# Patient Record
Sex: Male | Born: 1935 | Race: White | Hispanic: No | Marital: Married | Smoking: Former smoker
Health system: Southern US, Community
[De-identification: ages and names within clinical notes are randomized; demographics above are authoritative.]

## PROBLEM LIST (undated history)

## (undated) DIAGNOSIS — J45909 Unspecified asthma, uncomplicated: Secondary | ICD-10-CM

## (undated) DIAGNOSIS — H269 Unspecified cataract: Secondary | ICD-10-CM

## (undated) DIAGNOSIS — G20A1 Parkinson's disease without dyskinesia, without mention of fluctuations: Secondary | ICD-10-CM

## (undated) DIAGNOSIS — F0390 Unspecified dementia without behavioral disturbance: Secondary | ICD-10-CM

## (undated) DIAGNOSIS — I639 Cerebral infarction, unspecified: Secondary | ICD-10-CM

## (undated) HISTORY — DX: Unspecified cataract: H26.9

## (undated) HISTORY — PX: CHOLECYSTECTOMY: SHX55

## (undated) HISTORY — PX: APPENDECTOMY: SHX54

## (undated) HISTORY — DX: Unspecified asthma, uncomplicated: J45.909

## (undated) HISTORY — DX: Cerebral infarction, unspecified: I63.9

## (undated) HISTORY — PX: OTHER SURGICAL HISTORY: SHX169

---

## 2020-11-26 NOTE — Patient Instructions (Addendum)
Please stop by lab before you go If you have mychart- we will send your results within 3 business days of [place] receiving them.  If you do not have mychart- we will call you about results within 5 business days of [place] receiving them.  *please also note that you will see labs on mychart as soon as they post. I will later go in and write notes on them- will say "notes from Dr. Durene Cal"  Health Maintenance Due  Topic Date Due   COVID-19 Vaccine (1) has has all 3 shots will call back with dates. Never done   TETANUS/TDAP - recommend completing this at your pharmacy  Never done   PNA vac Low Risk Adult (1 of 2 - PCV13) - need to review your records Never done   INFLUENZA VACCINE Has has, Will call back with the dates.  Never done  -Please check with your pharmacy to see if they have the shingrix vaccine. If they do- please get this immunization and update [place] by phone call or mychart with dates you receive the vaccine  Sign release of information at the check out desk for records from primary care doctor including immunizations  We will call you within two weeks about your referral to Dr. Arbutus Leas of neurology. If you do not hear within 3 weeks, give [place] a call.   Sign release of information at the check out desk for neurologist  Call us with advair dosage and if you are taking that regularly- we can send this in for you- also sent in albuterol- goal for albuterol is to use twice a week or less- if you were to stop advair and you need albuterol frequently then we should restart advair. Keep me updated.   Mineral oil for ear full of wax Purchase mineral oil from laxative aisle Lay down on your side with ear that is bothering you facing up Use 3-4 drops with a dropper and place in ear for 30 seconds Place cotton swab outside of ear Turn to other side and allow this to drain Repeat 3-4 x a day Return to see [place] if not improving within a few days  Recommended follow up:7 months for physical (or at least  1 year from last one with prior PCP)

## 2020-11-26 NOTE — Progress Notes (Signed)
Phone: [phone]   Subjective:  Patient presents today to establish care.  Prior patient in [facility].  Chief Complaint  Patient presents with  . New Patient (Initial Visit)   See problem oriented charting  The following were reviewed and entered/updated in epic: Past Medical History:  Diagnosis Date  . Asthma   . Cataract   . Stroke [facility])    Patient Active Problem List   Diagnosis Date Noted  . Parkinson disease (HCC) 11/27/2020  . Hyperlipidemia 11/27/2020  . History of CVA (cerebrovascular accident) 11/27/2020  . Gastroesophageal reflux disease without esophagitis 11/27/2020   Past Surgical History:  Procedure Laterality Date  . APPENDECTOMY    . cataract surgery- both eyes    . CHOLECYSTECTOMY      Family History  Problem Relation Age of Onset  . Heart attack Mother        26  . Heart attack Father        85  . Cancer Brother        unknown type  . Bipolar disorder Daughter   . Arthritis Sister        back surgery and complications- ongoing antibiotics    Medications- reviewed and updated Current Outpatient Medications  Medication Sig Dispense Refill  . albuterol (VENTOLIN HFA) 108 (90 Base) MCG/ACT inhaler Inhale 2 puffs into the lungs every 6 (six) hours as needed for wheezing or shortness of breath. 1 each 2  . amLODipine-benazepril (LOTREL) 10-20 MG capsule Take 1 capsule by mouth daily.    Marland Kitchen aspirin EC 81 MG tablet Take 81 mg by mouth daily. Swallow whole.    Marland Kitchen atorvastatin (LIPITOR) 40 MG tablet Take 40 mg by mouth daily.    . carbidopa-levodopa (SINEMET IR) 25-100 MG tablet Take 1 tablet by mouth 3 (three) times daily.    . Cholecalciferol (VITAMIN D3 PO) Take 2 capsules by mouth daily.    . meloxicam (MOBIC) 15 MG tablet Take 15 mg by mouth daily.    Marland Kitchen omeprazole (PRILOSEC) 20 MG capsule Take 20 mg by mouth daily.    . propranolol (INDERAL) 20 MG tablet Take 20 mg by mouth daily.     No current facility-administered medications  for this visit.    Allergies-reviewed and updated No Known Allergies  Social History   Social History Narrative   Married. 2 amazing children- son pharmacist /lead pharmacist at [employer]. Son with 4, daughter with 0.       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 Oropharynx normal. TM normal. Eyes: sclera and lids normal, PERRLA Neck: no thyromegaly, no cervical lymphadenopathy CV: RRR no murmurs rubs or gallops Lungs: CTAB no crackles, wheeze, rhonchi Abdomen: soft/nontender/nondistended/normal bowel sounds. No rebound or guarding.  Ext: trace edema Skin: warm, dry Neuro: 5/5 strength in upper and lower extremities, shuffling gait, resting tremor noted    Assessment and Plan:   #social update- a lot of stress moving/closing on home- lost some weight but that has stabilized recently.  Coming  to visit with a new doctor was extremely stressful for him today  # Parkinson's disease S:diagnosed  At least 2019. On carbidopa levodopa 25-100mg  twice daily. Had neurologist in [employer] [place] that retired  Also on propranolol for tremor A/P: Patient with known Parkinson's-stable on carbidopa-levodopa as well as propranolol for tremor-I would like to get Dr. Don Perking expert opinion with neurology-referral was placed today  # history of CVA S:was having left arm weakness and saw neurology before moving to [facility] in August 2021.  Started him on aspirin 81mg  at the time. Had previously been on atorvastatin 40mg . Last cholesterol check in July 2021.   Denies residual weakness A/P: Patient with history of stroke and has  been appropriately started on aspirin 81 mg since that time.  We will get records to get more information to see what work-up was completed.  We will also continue aspirin 81 mg as well as atorvastatin 40 mg-thankfully LDL today below 70  #hypertension S: medication:  amlodipine- benazepril 10-20mg  in the morning Home readings #s: hard day today with new doctors visit and initial blood pressure was elevated BP Readings from Last 3 Encounters:  11/27/20 132/78  A/P: Good control blood pressure on repeat-continue current medication  #hyperlipidemia S: Medication: atorvastatin 40mg  Lab Results  Component Value Date   CHOL 134 11/27/2020   HDL 59 11/27/2020   LDLCALC 58 11/27/2020   TRIG 91 11/27/2020   CHOLHDL 2.3 11/27/2020   A/P: Excellent control with LDL under 70-continue current medication  #Creatinine 1.59 on labs.  Even before we saw this we discussed minimizing meloxicam for left hip and neck pain-with this elevation as well as high BUN-encouraged increased hydration and stopping meloxicam.  I would also like to see records to see how this compares to prior numbers  # GERD S:Medication: omeprazole 20mg   B12 levels related to PPI use: Lab Results  Component Value Date   VITAMINB12 304 11/27/2020  A/P: Reflux is well controlled-in the long run would prefer option like Pepcid-consider it future visit -With low normal B12 did recommend 1000 mcg weekly or 100 to 250 mcg daily  # Asthma S: Maintenance Medication: possibly on advair but also not aware of dose As needed medication: albuterol.  Has nebulizer if needed.  Patient not sure how often he is using albuterol or Advair A/P: I really need more information for this-asked patient to call back and let me know about Advair-he is going to look at home and see if he has been using this or albuterol-I did place an albuterol refill.  If he has been doing fine off Advair may be okay to simply use albuterol as needed   Recommended  follow up: Return in about 7 months (around 06/27/2021) for physical or sooner if needed. Future Appointments  Date Time Provider Department Center  07/06/2021 11:20 AM , MD [facility]    Meds ordered this encounter  Medications  . albuterol (VENTOLIN HFA) 108 (90 Base) MCG/ACT inhaler    Sig: Inhale 2 puffs into the lungs every 6 (six) hours as needed for wheezing or shortness of breath.    Dispense:  1 each    Refill:  2   Return precautions advised. 01/25/2021, MD

## 2020-11-27 ENCOUNTER — Other Ambulatory Visit: Payer: Self-pay

## 2020-11-27 ENCOUNTER — Ambulatory Visit (INDEPENDENT_AMBULATORY_CARE_PROVIDER_SITE_OTHER): Payer: Medicare Other | Admitting: Family Medicine

## 2020-11-27 ENCOUNTER — Encounter: Payer: Self-pay | Admitting: Family Medicine

## 2020-11-27 VITALS — BP 132/78 | HR 58 | Temp 98.1°F | Ht 68.0 in | Wt 170.0 lb

## 2020-11-27 DIAGNOSIS — Z8673 Personal history of transient ischemic attack (TIA), and cerebral infarction without residual deficits: Secondary | ICD-10-CM | POA: Insufficient documentation

## 2020-11-27 DIAGNOSIS — E785 Hyperlipidemia, unspecified: Secondary | ICD-10-CM | POA: Insufficient documentation

## 2020-11-27 DIAGNOSIS — Z79899 Other long term (current) drug therapy: Secondary | ICD-10-CM

## 2020-11-27 DIAGNOSIS — G20 Parkinson's disease: Secondary | ICD-10-CM

## 2020-11-27 DIAGNOSIS — K219 Gastro-esophageal reflux disease without esophagitis: Secondary | ICD-10-CM | POA: Insufficient documentation

## 2020-11-27 MED ORDER — ALBUTEROL SULFATE HFA 108 (90 BASE) MCG/ACT IN AERS: 2.0000 | INHALATION_SPRAY | Freq: Four times a day (QID) | RESPIRATORY_TRACT | 2 refills | Status: DC | PRN

## 2020-11-28 ENCOUNTER — Encounter: Payer: Self-pay | Admitting: Family Medicine

## 2020-11-28 LAB — CBC WITH DIFFERENTIAL/PLATELET
Absolute Monocytes: 668 cells/uL (ref 200–950)
Basophils Absolute: 38 cells/uL (ref 0–200)
Basophils Relative: 0.6 %
Eosinophils Absolute: 208 cells/uL (ref 15–500)
Eosinophils Relative: 3.3 %
HCT: 39.8 % (ref 38.5–50.0)
Hemoglobin: 13.6 g/dL (ref 13.2–17.1)
Lymphs Abs: 1203 cells/uL (ref 850–3900)
MCH: 32.5 pg (ref 27.0–33.0)
MCHC: 34.2 g/dL (ref 32.0–36.0)
MCV: 95 fL (ref 80.0–100.0)
MPV: 10.5 fL (ref 7.5–12.5)
Monocytes Relative: 10.6 %
Neutro Abs: 4183 cells/uL (ref 1500–7800)
Neutrophils Relative %: 66.4 %
Platelets: 267 10*3/uL (ref 140–400)
RBC: 4.19 10*6/uL — ABNORMAL LOW (ref 4.20–5.80)
RDW: 12.8 % (ref 11.0–15.0)
Total Lymphocyte: 19.1 %
WBC: 6.3 10*3/uL (ref 3.8–10.8)

## 2020-11-28 LAB — COMPREHENSIVE METABOLIC PANEL
AG Ratio: 1.8 (calc) (ref 1.0–2.5)
ALT: 4 U/L — ABNORMAL LOW (ref 9–46)
AST: 21 U/L (ref 10–35)
Albumin: 4.6 g/dL (ref 3.6–5.1)
Alkaline phosphatase (APISO): 69 U/L (ref 35–144)
BUN/Creatinine Ratio: 20 (calc) (ref 6–22)
BUN: 32 mg/dL — ABNORMAL HIGH (ref 7–25)
CO2: 26 mmol/L (ref 20–32)
Calcium: 9.9 mg/dL (ref 8.6–10.3)
Chloride: 105 mmol/L (ref 98–110)
Creat: 1.59 mg/dL — ABNORMAL HIGH (ref 0.70–1.11)
Globulin: 2.5 g/dL (calc) (ref 1.9–3.7)
Glucose, Bld: 96 mg/dL (ref 65–99)
Potassium: 4.4 mmol/L (ref 3.5–5.3)
Sodium: 143 mmol/L (ref 135–146)
Total Bilirubin: 0.8 mg/dL (ref 0.2–1.2)
Total Protein: 7.1 g/dL (ref 6.1–8.1)

## 2020-11-28 LAB — LIPID PANEL
Cholesterol: 134 mg/dL (ref <200)
HDL: 59 mg/dL (ref >=40)
LDL Cholesterol (Calc): 58 mg/dL (calc)
Non-HDL Cholesterol (Calc): 75 mg/dL (calc) (ref <130)
Total CHOL/HDL Ratio: 2.3 (calc) (ref <5.0)
Triglycerides: 91 mg/dL (ref <150)

## 2020-11-28 LAB — VITAMIN B12: Vitamin B-12: 304 pg/mL (ref 200–1100)

## 2020-12-01 ENCOUNTER — Other Ambulatory Visit: Payer: Self-pay | Admitting: Family Medicine

## 2020-12-03 ENCOUNTER — Encounter: Payer: Self-pay | Admitting: Neurology

## 2020-12-04 ENCOUNTER — Other Ambulatory Visit: Payer: Self-pay | Admitting: Family Medicine

## 2020-12-10 NOTE — Progress Notes (Signed)
Assessment/Plan:   1.  Parkinson's disease, appears that the patient was diagnosed in approximately 2018  -increase carbidopa/levodopa 25/100, 2 at 8am/1 at noon/1 at 4pm  -add carbidopa/levodopa 50/200 CR at bed  -d/c propranolol  -start PT/ST 2.  History of cerebral infarct in June, 2021  -Patient on aspirin, 81 mg daily.  -Patient on Lipitor, 40 mg daily.  His LDL is at goal.  -We will do a carotid ultrasound just to make sure we are not missing anything.  Do not see that this was done as part of stroke work-up.  I think we can leave the echocardiogram for now given that this was not large vessel.  3.  Memory loss, suspect PDD  -they decline neurocog testing today but will think about it  -recommend no driving (wife stated he wasn't)  Subjective:   Darren [name] was seen today in the movement disorders clinic for neurologic consultation at the request of [name], MD.  The consultation is for the evaluation of Parkinsons Disease.  Pt previously seen by [name]. Records from [name] are reviewed.  Pt with wife who supplements hx  Patient began to see [name] in May, 2020. Patient reported at that visit and that he had been transferring care from a [name] and was diagnosed with Parkinson's disease 2 years prior.  First sx was R hand tremor. He had already been on carbidopa/levodopa 25/100, but was only taking half tablet three times per day when he saw [name]. He was also on propranolol, 20 mg daily for tremor.  Records indicate that he was previously on Diamox for essential tremor as well as primidone (unknown dose).  Records indicate that his levodopa was increased to 1 tablet 3 times per day in August, 2020.  Records indicate that this was very beneficial for his tremor.  Patient was last seen by [facility] neurology in June, 2021 and this visit was a follow-up for his stroke.   Separate from the above, the patient had an episode in June, 2021 where  he could not raise the right arm (pt states that it was left arm).  He also was sleepy and confused.  The patient thought that the symptoms could be from the levodopa and he ended up decreasing the dosage.  His neurologist thought symptoms were associated with a stroke.  MRI brain was completed on April 30, 2020 and there was, in fact, evidence of DWI restriction in the right parietal cerebral hemisphere (note that records indicate that patient's symptoms were also on the right but patient states that sx's were on the L).  There was moderate to moderately severe diffuse atrophy.  There was an old right frontal infarction.  Patient started on aspirin, 81 mg daily.  Patient was already on Lipitor.  I do not see that any carotid ultrasound was completed and patient states agrees with that.  physcial sx's got better after that but confusion worse after.    Specific Symptoms:  Tremor: Yes.  , mostly with intention bilaterally (is why taking the propranolol - states that he also has asthma and tremor is worse when having trouble breathing) Family hx of similar:  No., no fam hx of PD Voice: softer - never did LSVT Sleep: sleeps well per pt but wife states that he goes to bed early and then awakens early and cannot get back to sleep  Vivid Dreams:  No.  Acting out dreams:  Yes.  , just talking  Wet Pillows: No. Postural symptoms:  Yes.    Falls?  No. Bradykinesia symptoms: shuffling gait, slow movements and difficulty getting out of a chair Loss of smell:  Yes.   Loss of taste:  Yes.   Urinary Incontinence:  No. Difficulty Swallowing:  Yes.   - just because not chewing food enough Handwriting, micrographia: Yes.   and shaky Trouble with ADL's:  No.  Trouble buttoning clothing: No. Depression:  No. but admits to anxiety Memory changes:  Yes.   , short term; wife helps with med x 1 year; they do pill box together; wife does bills x 1 year because pt getting confused; pt states that he drives but wife states  that he doesn't except to the mailbox.   Hallucinations:  No.  visual distortions: No. N/V:  No. Lightheaded:  No.  Syncope: No. Diplopia:  No. Dyskinesia:  No. Prior exposure to reglan/antipsychotics: No.   Current movement d/o meds: carbidopa/levodopa 25/100, 8am/noon/bed per wife Propranolol 20 mg daily   PREVIOUS MEDICATIONS:  carbidopa/levodopa 25/100, 1 tablet twice per day; propranolol 20 mg daily; primidone; acetazolamide  ALLERGIES:  No Known Allergies  CURRENT MEDICATIONS:  Current Outpatient Medications  Medication Instructions  . albuterol (VENTOLIN HFA) 108 (90 Base) MCG/ACT inhaler 2 puffs, Inhalation, Every 6 hours PRN  . amLODipine-benazepril (LOTREL) 10-20 MG capsule TAKE 1 CAPSULE BY MOUTH EVERY DAY  . aspirin EC 81 mg, Oral, Daily, Swallow whole.  Marland Kitchen atorvastatin (LIPITOR) 40 mg, Oral, Daily  . carbidopa-levodopa (SINEMET IR) 25-100 MG tablet 1 tablet, Oral, 3 times daily  . Cholecalciferol (VITAMIN D3 PO) 2 capsules, Oral, Daily  . Fluticasone-Salmeterol (ADVAIR) 100-50 MCG/DOSE AEPB inhale 1 PUFF 2 TIMES DAILY  . omeprazole (PRILOSEC) 20 mg, Oral, Daily  . propranolol (INDERAL) 20 mg, Oral, Daily    Objective:   VITALS:   Vitals:   12/15/20 0836  BP: (!) 100/52  Pulse: 74  SpO2: 98%  Weight: 175 lb (79.4 kg)  Height: 5\' 10"  (1.778 m)    GEN:  The patient appears stated age and is in NAD. HEENT:  Normocephalic, atraumatic.  The mucous membranes are moist. The superficial temporal arteries are without ropiness or tenderness. CV:  RRR Lungs:  CTAB Neck/HEME:  There are no carotid bruits bilaterally.  Neurological examination:  Orientation: The patient is alert and oriented x3.  Cranial nerves: There is good facial symmetry. There is facial hypomimia.  Extraocular muscles are intact. The visual fields are full to confrontational testing. The speech is fluent and clear.  He is hypophonic.   Soft palate rises symmetrically and there is no tongue  deviation. Hearing is intact to conversational tone. Sensation: Sensation is intact to light and pinprick throughout (facial, trunk, extremities). Vibration is intact at the bilateral big toe. There is no extinction with double simultaneous stimulation. There is no sensory dermatomal level identified. Motor: Strength is 5/5 in the bilateral upper and lower extremities.   Shoulder shrug is equal and symmetric.  There is no pronator drift. Deep tendon reflexes: Deep tendon reflexes are 2/4 at the bilateral biceps, triceps, brachioradialis, 2+ -3 at the bilateral patella. Plantar responses are downgoing bilaterally.  Movement examination: Tone: There is mild to mod increased tone in the RUE Abnormal movements: mild and intermittent RUE rest tremor Coordination:  There is mild decremation with RAM's, with any form of RAMS, including alternating supination and pronation of the forearm, hand opening and closing, finger taps, heel taps and toe taps bilaterally Gait and Station:  The patient has  difficulty arising out of a deep-seated chair without the use of the hands and is unable to do so (makes 3 attempts). The patient's stride length is decreased.   I have reviewed and interpreted the following labs independently   Chemistry      Component Value Date/Time   NA 143 11/27/2020 1514   K 4.4 11/27/2020 1514   CL 105 11/27/2020 1514   CO2 26 11/27/2020 1514   BUN 32 (H) 11/27/2020 1514   CREATININE 1.59 (H) 11/27/2020 1514      Component Value Date/Time   CALCIUM 9.9 11/27/2020 1514   AST 21 11/27/2020 1514   ALT 4 (L) 11/27/2020 1514   BILITOT 0.8 11/27/2020 1514      No results found for: TSH Lab Results  Component Value Date   WBC 6.3 11/27/2020   HGB 13.6 11/27/2020   HCT 39.8 11/27/2020   MCV 95.0 11/27/2020   PLT 267 11/27/2020   Lab Results  Component Value Date   CHOL 134 11/27/2020   Lab Results  Component Value Date   HDL 59 11/27/2020   Lab Results  Component Value  Date   LDLCALC 58 11/27/2020   Lab Results  Component Value Date   TRIG 91 11/27/2020   Lab Results  Component Value Date   CHOLHDL 2.3 11/27/2020   No results found for: LDLDIRECT   Total time spent on today's visit was 70 minutes, including both face-to-face time and nonface-to-face time.  Time included that spent on review of records (prior notes available to me/labs/imaging if pertinent), discussing treatment and goals, answering patient's questions and coordinating care.  Cc:  [name], MD

## 2020-12-15 ENCOUNTER — Ambulatory Visit (INDEPENDENT_AMBULATORY_CARE_PROVIDER_SITE_OTHER): Payer: Medicare Other | Admitting: Neurology

## 2020-12-15 ENCOUNTER — Other Ambulatory Visit: Payer: Self-pay

## 2020-12-15 ENCOUNTER — Encounter: Payer: Self-pay | Admitting: Neurology

## 2020-12-15 VITALS — BP 100/52 | HR 74 | Ht 70.0 in | Wt 175.0 lb

## 2020-12-15 DIAGNOSIS — I63411 Cerebral infarction due to embolism of right middle cerebral artery: Secondary | ICD-10-CM

## 2020-12-15 DIAGNOSIS — G20 Parkinson's disease: Secondary | ICD-10-CM | POA: Diagnosis not present

## 2020-12-15 DIAGNOSIS — R413 Other amnesia: Secondary | ICD-10-CM

## 2020-12-15 MED ORDER — CARBIDOPA-LEVODOPA 25-100 MG PO TABS: ORAL_TABLET | ORAL | 1 refills | Status: DC

## 2020-12-15 MED ORDER — CARBIDOPA-LEVODOPA ER 50-200 MG PO TBCR: 1.0000 | EXTENDED_RELEASE_TABLET | Freq: Every day | ORAL | 1 refills | Status: DC

## 2020-12-15 NOTE — Patient Instructions (Addendum)
1.  Take carbidopa/levodopa 25/100, 2 tablets at 8am, 1 at noon, 1 at 4pm 2.  START carbidopa/levodopa 50/200 CR at bedtime 3.  STOP propranolol 4.  You can let us know if you would like to schedule the memory testing  The physicians and staff at [facility] are committed to providing excellent care. You may receive a survey requesting feedback about your experience at our office. We strive to receive "very good" responses to the survey questions. If you feel that your experience would prevent you from giving the office a "very good " response, please contact our office to try to remedy the situation. We may be reached at [phone]. Thank you for taking the time out of your busy day to complete the survey.

## 2021-01-26 ENCOUNTER — Telehealth: Payer: Self-pay

## 2021-01-26 ENCOUNTER — Other Ambulatory Visit: Payer: Self-pay

## 2021-01-26 ENCOUNTER — Ambulatory Visit (HOSPITAL_COMMUNITY)
Admission: RE | Admit: 2021-01-26 | Discharge: 2021-01-26 | Disposition: A | Discharge status: Home / Self Care | Payer: Medicare Other | Source: Ambulatory Visit | Attending: Internal Medicine | Admitting: Internal Medicine

## 2021-01-26 DIAGNOSIS — I63411 Cerebral infarction due to embolism of right middle cerebral artery: Secondary | ICD-10-CM

## 2021-01-26 NOTE — Telephone Encounter (Signed)
Spoke with patients wife who wants to know if this is what making the patient fuzzy. She states the patient can not tell the difference between. She states if the tremor medication is making him fuzzy and if it is then she would rather him have the tremors than to be fuzzy. She states the patient wakes her up at night. She states she is a little concerned and doesn't know what to do. She also wants to know if we changed any of the patients other medication.  She also wants to thank you for all you have done so far.

## 2021-01-26 NOTE — Telephone Encounter (Signed)
I had suspected previously that he had dementia but they didn't want to do the neurocog testing.  The medication could be potentially contributing but I thought he had memory issues prior to med change.  Do they want to go ahead and schedule that now?  We can put him on cx list as well.

## 2021-01-27 NOTE — Telephone Encounter (Signed)
Received another call from the patients wife wanting to know if there was a way to tell if the patient had dementia. Advised her that when I spoke with her earlier Dr Tat suggested the patient do the neurocog test. She states the patient does not want to do the test because the patient does not want to seem dumb. She states she does not want to put him through it and he doesn't want to do it.   I advised her there was not much more we could do. She voiced understanding and states right now they do not want to do anything.

## 2021-01-27 NOTE — Telephone Encounter (Signed)
Spoke with patients wife and gave her Dr Iona Beard recommendations. Wife voiced understanding, but stated she doesn't want to put the patient through the neurocog testing but she will speak with her son first. She states she will contact the office if she wants to do the test.

## 2021-01-29 ENCOUNTER — Telehealth: Payer: Self-pay | Admitting: Neurology

## 2021-01-29 NOTE — Telephone Encounter (Signed)
Called patient to offer a wait list appt. Spoke with wife, she declined the appt and took him off the cxl list. She said that if all the test will do is confirm dementia or alzheimers then she doesn't want to put him through it. They're fine keeping his appt with Dr Arbutus Leas in June

## 2021-03-19 ENCOUNTER — Telehealth: Payer: Self-pay | Admitting: Neurology

## 2021-03-19 NOTE — Telephone Encounter (Signed)
New message    Patient wife calling needs handicap sticker form filled out due to excessive walking.

## 2021-03-19 NOTE — Telephone Encounter (Signed)
No problem.  Rhae Hammock has them pre-populated with my information.  Just let me sign

## 2021-03-19 NOTE — Telephone Encounter (Signed)
Spoke with pt wife informed her that paper work for handicap placard is at the front desk for her to pick up

## 2021-03-22 ENCOUNTER — Telehealth: Payer: Self-pay

## 2021-03-22 DIAGNOSIS — R7989 Other specified abnormal findings of blood chemistry: Secondary | ICD-10-CM

## 2021-03-22 NOTE — Telephone Encounter (Signed)
Pt wife called requesting Dr. Durene Cal put pt back on Meloxicam 15 mg. Please advise.

## 2021-03-22 NOTE — Telephone Encounter (Signed)
Patients wife states that Mr. Marlatt is having to use advil and putting icy hot on his back and it's not working as well as the meloxicam does.. She is requesting to place him back on this medication because it helps the chronic him and back pain that he's having.

## 2021-03-22 NOTE — Telephone Encounter (Signed)
Unable to reach patient. LMTRC. I did call to get more information on why the patient needed to be placed back on this medication.

## 2021-03-23 NOTE — Telephone Encounter (Signed)
Called and lm for pt tcb, when pt calls back please schedule lab visit.

## 2021-03-23 NOTE — Telephone Encounter (Signed)
Have another BMP checked- want to see how kidneys are doing before we restart meloxicam and may need to use lower dose like 7.5 mg

## 2021-03-23 NOTE — Telephone Encounter (Signed)
Elevated creatinine

## 2021-03-23 NOTE — Telephone Encounter (Signed)
What to order the BMP under?

## 2021-03-24 ENCOUNTER — Encounter: Payer: Self-pay | Admitting: Family Medicine

## 2021-03-25 ENCOUNTER — Other Ambulatory Visit: Payer: Self-pay

## 2021-03-25 ENCOUNTER — Other Ambulatory Visit (INDEPENDENT_AMBULATORY_CARE_PROVIDER_SITE_OTHER): Payer: Medicare Other

## 2021-03-25 DIAGNOSIS — R7989 Other specified abnormal findings of blood chemistry: Secondary | ICD-10-CM | POA: Diagnosis not present

## 2021-03-25 LAB — BASIC METABOLIC PANEL
BUN: 23 mg/dL (ref 6–23)
CO2: 27 mEq/L (ref 19–32)
Calcium: 9.6 mg/dL (ref 8.4–10.5)
Chloride: 106 mEq/L (ref 96–112)
Creatinine, Ser: 1.2 mg/dL (ref 0.40–1.50)
GFR: 55.58 mL/min — ABNORMAL LOW (ref >60.00)
Glucose, Bld: 94 mg/dL (ref 70–99)
Potassium: 4.2 mEq/L (ref 3.5–5.1)
Sodium: 143 mEq/L (ref 135–145)

## 2021-03-25 NOTE — Telephone Encounter (Signed)
Patient was seen in the office today for a lab visit.

## 2021-03-26 ENCOUNTER — Other Ambulatory Visit: Payer: Self-pay

## 2021-03-26 DIAGNOSIS — R7989 Other specified abnormal findings of blood chemistry: Secondary | ICD-10-CM

## 2021-03-26 MED ORDER — MELOXICAM 7.5 MG PO TABS: 7.5000 mg | ORAL_TABLET | Freq: Every day | ORAL | 0 refills | Status: DC

## 2021-03-29 ENCOUNTER — Other Ambulatory Visit: Payer: Medicare Other

## 2021-04-30 ENCOUNTER — Other Ambulatory Visit: Payer: Self-pay | Admitting: Neurology

## 2021-05-05 ENCOUNTER — Telehealth: Payer: Self-pay | Admitting: Neurology

## 2021-05-05 NOTE — Telephone Encounter (Signed)
I haven't seen patient since Jan but if this is a significant change, should be evaluated today, either by PCP to see if he has a UTI, etc or go to UC/ER

## 2021-05-05 NOTE — Telephone Encounter (Signed)
Patient's wife called and said patient is disoriented and does not recognize the house today. Transferred to [place].

## 2021-05-05 NOTE — Telephone Encounter (Signed)
I advised Er and spoke with wife.

## 2021-05-09 ENCOUNTER — Encounter: Payer: Self-pay | Admitting: Family Medicine

## 2021-05-17 ENCOUNTER — Other Ambulatory Visit: Payer: Medicare Other

## 2021-05-18 NOTE — Progress Notes (Signed)
Assessment/Plan:   1.  Parkinsons Disease, diagnosed 2018  -Continue carbidopa/levodopa 25/100, 2/1/1  -Continue carbidopa/levodopa 50/200 CR at bedtime  -Wife to go home and check and make sure he is off of the propranolol.  She did not remember stopping that last visit.  -discussed advanced directives - wants everything done  -wife has POA for healthcare and finances.  2.  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  Last ultrasound was in March, 2022.  We will monitor this approximately yearly.  -On aspirin, 81 mg daily.  -On Lipitor, 40 mg daily.  LDL was at goal.  Primary care monitoring.  3.  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  He is not driving.  -Wife talked about trouble with caregiving in the home with new onset aggression and difficulty with sleep.  We discussed quetiapine.  We did talk about the fact that the atypical antipsychotic medications are not indicated for dementia related psychosis and increase risk of mortality in the elderly, usually because of infectious or  cardiac related etiologies.  We discussed prolongation of the QT interval and what that means.  QTC was normal about a year ago.  We discussed the black box warning in detail and how it applies in this case.  Family believes that QOL is important and they have tried nonpharmacologic therapies (maintaining schedule, attempting proper sleep hydration, redirecting, etc) without success.  They would like to continue to caregive in the home and believe that this is the only way, but worry that pt is a threat to self/others.  After this long discussion, family decided to try the medication as they feel that the benefits outweigh the risks in this case.  We are going to start at an extremely low dose, quetiapine, 12.5 mg at bedtime.  This certainly may not be enough.  Wife is instructed to call me in 2 weeks and let  me know how he is doing.  He may need a higher bedtime dose and ultimately may need some daytime dosages as well.    Subjective:   Darren [name] was seen today in follow up for Parkinsons disease.  My previous records were reviewed prior to todays visit as well as outside records available to me.  Last visit, propanolol was discontinued, levodopa was increased and CR levodopa was added at bedtime.   pt denies falls but he has had close calls.  Pt denies lightheadedness, near syncope.  Wife reports patient having some hallucinations - describes an illusion to me.  No seeing people but there are vivid dreams that he may have trouble separating from reality.  Up much of the night and roaming - "I need something to knock him out."   Mood has been anxious.  Wife notes "for the first time he has been belligerent with me."  Patient had also previously had a history of cerebral infarction about a year ago, prior to seeing me.  I did not see that his stroke work-up had been completed, so we did a carotid ultrasound.  There was up to 59% stenosis on the right (same side stroke was on).  He does have a history of memory change, with neurocognitive testing being declined.  They  called me on June 15 to state that he did not recognize the house that day.  Discussed with them that I had not seen him since January, but if that was  a significant change, he needed to be evaluated that day.  I do not see that was done, at least within our system.  EMS came but didn't take him to the hospital.    Current prescribed movement disorder medications: Carbidopa/levodopa 25/100, 2/1/1 (increased last visit) Carbidopa/levodopa 50/200 CR at bedtime (started last visit) Stopped propranolol last visit   PREVIOUS MEDICATIONS: propranolol  ALLERGIES:  No Known Allergies  CURRENT MEDICATIONS:  Outpatient Encounter Medications as of 05/19/2021  Medication Sig   albuterol (VENTOLIN HFA) 108 (90 Base) MCG/ACT inhaler Inhale 2  puffs into the lungs every 6 (six) hours as needed for wheezing or shortness of breath.   amLODipine-benazepril (LOTREL) 10-20 MG capsule TAKE 1 CAPSULE BY MOUTH EVERY DAY   aspirin EC 81 MG tablet Take 81 mg by mouth daily. Swallow whole.   atorvastatin (LIPITOR) 40 MG tablet Take 40 mg by mouth daily.   carbidopa-levodopa (SINEMET CR) 50-200 MG tablet Take 1 tablet by mouth at bedtime.   carbidopa-levodopa (SINEMET IR) 25-100 MG tablet 2 tablets at 8am, 1 at noon, 1 at 4pm   Cholecalciferol (VITAMIN D3 PO) Take 2 capsules by mouth daily.   Fluticasone-Salmeterol (ADVAIR) 100-50 MCG/DOSE AEPB inhale 1 PUFF 2 TIMES DAILY   meloxicam (MOBIC) 7.5 MG tablet Take 1 tablet (7.5 mg total) by mouth daily.   omeprazole (PRILOSEC) 20 MG capsule Take 20 mg by mouth daily.   No facility-administered encounter medications on file as of 05/19/2021.    Objective:   PHYSICAL EXAMINATION:    VITALS:   Vitals:   05/19/21 0926  BP: 138/68  Pulse: 74  SpO2: 95%  Weight: 168 lb (76.2 kg)  Height: 5\' 10"  (1.778 m)    GEN:  The patient appears stated age and is in NAD. HEENT:  Normocephalic, atraumatic.  The mucous membranes are moist. The superficial temporal arteries are without ropiness or tenderness. CV:  RRR Lungs:  CTAB Neck/HEME:  There are no carotid bruits bilaterally.  Neurological examination:  Orientation: The patient is alert and oriented to person Cranial nerves: There is good facial symmetry with facial hypomimia. The speech is fluent and clear. Soft palate rises symmetrically and there is no tongue deviation. Hearing is intact to conversational tone. Sensation: Sensation is intact to light touch throughout Motor: Strength is at least antigravity x4.  Movement examination: Tone: There is nl tone in the ue/le Abnormal movements: RUE only with ambulation Coordination:  There is only apraxia on the L with RAMs and not much decremation Gait and Station: The patients stride length is  decreased.  Ambulates with cane   I have reviewed and interpreted the following labs independently    Chemistry      Component Value Date/Time   NA 143 03/25/2021 0859   K 4.2 03/25/2021 0859   CL 106 03/25/2021 0859   CO2 27 03/25/2021 0859   BUN 23 03/25/2021 0859   CREATININE 1.20 03/25/2021 0859   CREATININE 1.59 (H) 11/27/2020 1514      Component Value Date/Time   CALCIUM 9.6 03/25/2021 0859   AST 21 11/27/2020 1514   ALT 4 (L) 11/27/2020 1514   BILITOT 0.8 11/27/2020 1514       Lab Results  Component Value Date   WBC 6.3 11/27/2020   HGB 13.6 11/27/2020   HCT 39.8 11/27/2020   MCV 95.0 11/27/2020   PLT 267 11/27/2020    No results found for: TSH   Total time spent on today's visit was 01/25/2021, including  both face-to-face time and nonface-to-face time.  Time included that spent on review of records (prior notes available to me/labs/imaging if pertinent), discussing treatment and goals, answering patient's questions and coordinating care.  Cc:  [name], MD

## 2021-05-19 ENCOUNTER — Other Ambulatory Visit: Payer: Self-pay

## 2021-05-19 ENCOUNTER — Ambulatory Visit (INDEPENDENT_AMBULATORY_CARE_PROVIDER_SITE_OTHER): Payer: Medicare Other | Admitting: Neurology

## 2021-05-19 ENCOUNTER — Encounter: Payer: Self-pay | Admitting: Neurology

## 2021-05-19 VITALS — BP 138/68 | HR 74 | Ht 70.0 in | Wt 168.0 lb

## 2021-05-19 DIAGNOSIS — G20 Parkinson's disease: Secondary | ICD-10-CM

## 2021-05-19 DIAGNOSIS — I63411 Cerebral infarction due to embolism of right middle cerebral artery: Secondary | ICD-10-CM

## 2021-05-19 DIAGNOSIS — F0281 Dementia in other diseases classified elsewhere with behavioral disturbance: Secondary | ICD-10-CM

## 2021-05-19 MED ORDER — QUETIAPINE FUMARATE 25 MG PO TABS: 12.5000 mg | ORAL_TABLET | Freq: Every day | ORAL | 1 refills | Status: DC

## 2021-05-19 NOTE — Patient Instructions (Addendum)
Make sure you are not on propranolol when you go home. We will start very low dose quetiapine (seroquel).  We discussed that black box warning on the medication.  We are starting with 25 mg, 1/2 tablet at bedtime.  This may not be enough but you can call me in 2 weeks if it isn't and we can adjust the medication.

## 2021-05-20 ENCOUNTER — Other Ambulatory Visit: Payer: Self-pay | Admitting: Neurology

## 2021-05-20 ENCOUNTER — Other Ambulatory Visit (INDEPENDENT_AMBULATORY_CARE_PROVIDER_SITE_OTHER): Payer: Medicare Other

## 2021-05-20 DIAGNOSIS — R7989 Other specified abnormal findings of blood chemistry: Secondary | ICD-10-CM | POA: Diagnosis not present

## 2021-05-20 LAB — BASIC METABOLIC PANEL
BUN: 33 mg/dL — ABNORMAL HIGH (ref 6–23)
CO2: 28 mEq/L (ref 19–32)
Calcium: 9.4 mg/dL (ref 8.4–10.5)
Chloride: 108 mEq/L (ref 96–112)
Creatinine, Ser: 1.25 mg/dL (ref 0.40–1.50)
GFR: 52.87 mL/min — ABNORMAL LOW (ref >60.00)
Glucose, Bld: 83 mg/dL (ref 70–99)
Potassium: 4.1 mEq/L (ref 3.5–5.1)
Sodium: 143 mEq/L (ref 135–145)

## 2021-05-28 ENCOUNTER — Other Ambulatory Visit: Payer: Self-pay | Admitting: Family Medicine

## 2021-05-31 ENCOUNTER — Other Ambulatory Visit: Payer: Self-pay | Admitting: Family Medicine

## 2021-06-02 ENCOUNTER — Telehealth: Payer: Self-pay | Admitting: Neurology

## 2021-06-02 NOTE — Telephone Encounter (Signed)
Pt wife called regarding williams medicine, quetiapine sumarate. The new meds she gave him seem to be doing fine.

## 2021-06-29 ENCOUNTER — Encounter: Payer: Medicare Other | Admitting: Family Medicine

## 2021-07-06 ENCOUNTER — Other Ambulatory Visit: Payer: Self-pay

## 2021-07-06 ENCOUNTER — Encounter: Payer: Self-pay | Admitting: Family Medicine

## 2021-07-06 ENCOUNTER — Ambulatory Visit (INDEPENDENT_AMBULATORY_CARE_PROVIDER_SITE_OTHER): Payer: Medicare Other | Admitting: Family Medicine

## 2021-07-06 VITALS — BP 135/68 | HR 65 | Temp 98.2°F | Ht 70.0 in | Wt 167.6 lb

## 2021-07-06 DIAGNOSIS — I10 Essential (primary) hypertension: Secondary | ICD-10-CM | POA: Insufficient documentation

## 2021-07-06 DIAGNOSIS — E785 Hyperlipidemia, unspecified: Secondary | ICD-10-CM | POA: Diagnosis not present

## 2021-07-06 DIAGNOSIS — G20 Parkinson's disease: Secondary | ICD-10-CM

## 2021-07-06 DIAGNOSIS — Z8673 Personal history of transient ischemic attack (TIA), and cerebral infarction without residual deficits: Secondary | ICD-10-CM | POA: Diagnosis not present

## 2021-07-06 DIAGNOSIS — K219 Gastro-esophageal reflux disease without esophagitis: Secondary | ICD-10-CM

## 2021-07-06 DIAGNOSIS — I63411 Cerebral infarction due to embolism of right middle cerebral artery: Secondary | ICD-10-CM | POA: Diagnosis not present

## 2021-07-06 LAB — CBC WITH DIFFERENTIAL/PLATELET
Basophils Absolute: 0 10*3/uL (ref 0.0–0.1)
Basophils Relative: 0.5 % (ref 0.0–3.0)
Eosinophils Absolute: 0.2 10*3/uL (ref 0.0–0.7)
Eosinophils Relative: 3 % (ref 0.0–5.0)
HCT: 37.2 % — ABNORMAL LOW (ref 39.0–52.0)
Hemoglobin: 12.7 g/dL — ABNORMAL LOW (ref 13.0–17.0)
Lymphocytes Relative: 17.6 % (ref 12.0–46.0)
Lymphs Abs: 1.1 10*3/uL (ref 0.7–4.0)
MCHC: 34.1 g/dL (ref 30.0–36.0)
MCV: 95.1 fl (ref 78.0–100.0)
Monocytes Absolute: 0.5 10*3/uL (ref 0.1–1.0)
Monocytes Relative: 7.4 % (ref 3.0–12.0)
Neutro Abs: 4.4 10*3/uL (ref 1.4–7.7)
Neutrophils Relative %: 71.5 % (ref 43.0–77.0)
Platelets: 216 10*3/uL (ref 150.0–400.0)
RBC: 3.91 Mil/uL — ABNORMAL LOW (ref 4.22–5.81)
RDW: 13.7 % (ref 11.5–15.5)
WBC: 6.1 10*3/uL (ref 4.0–10.5)

## 2021-07-06 LAB — COMPREHENSIVE METABOLIC PANEL
ALT: 3 U/L (ref 0–53)
AST: 18 U/L (ref 0–37)
Albumin: 4.3 g/dL (ref 3.5–5.2)
Alkaline Phosphatase: 61 U/L (ref 39–117)
BUN: 27 mg/dL — ABNORMAL HIGH (ref 6–23)
CO2: 28 mEq/L (ref 19–32)
Calcium: 9.4 mg/dL (ref 8.4–10.5)
Chloride: 107 mEq/L (ref 96–112)
Creatinine, Ser: 1.23 mg/dL (ref 0.40–1.50)
GFR: 53.85 mL/min — ABNORMAL LOW (ref >60.00)
Glucose, Bld: 92 mg/dL (ref 70–99)
Potassium: 4.5 mEq/L (ref 3.5–5.1)
Sodium: 142 mEq/L (ref 135–145)
Total Bilirubin: 0.6 mg/dL (ref 0.2–1.2)
Total Protein: 6.7 g/dL (ref 6.0–8.3)

## 2021-07-06 MED ORDER — FLUTICASONE-SALMETEROL 100-50 MCG/ACT IN AEPB: 1.0000 | INHALATION_SPRAY | Freq: Two times a day (BID) | RESPIRATORY_TRACT | 11 refills | Status: DC

## 2021-07-06 NOTE — Patient Instructions (Addendum)
Health Maintenance Due  Topic Date Due   TETANUS/TDAP patient will get this done at his local pharmacy.  Never done   Zoster Vaccines- Shingrix (1 of 2) Please check with your pharmacy to see if they have the shingrix vaccine. If they do- please get this immunization and update [place] by phone call or mychart with dates you receive the vaccine  Never done   PNA vac Low Risk Adult - recommend Prevnar 20 at the pharmacy (we can also give today if you would like) Never done   INFLUENZA VACCINE Patient will get this done at his local pharmacy. Please consider getting your flu shot in the Fall. If you get this outside of our office, please let us know.  06/21/2021   Please stop by lab before you go If you have mychart- we will send your results within 3 business days of [place] receiving them.  If you do not have mychart- we will call you about results within 5 business days of [place] receiving them.  *please also note that you will see labs on mychart as soon as they post. I will later go in and write notes on them- will say "notes from Dr. Durene Cal"  Restart Advair. Goal for albuterol would be twice a week or less. If you find yourself using less than this, please call back and let us know if breathing doesn't improve in next 2-3 weeks  Start Vitamin B-12 1000 mcg once a week due to low normal b12 (over the counter)  Please Consider trying Pepcid in the future. You wanted to hold off for now  Recommended follow up: Return in about 6 months (around 01/06/2022) for follow-up or sooner if needed.

## 2021-07-06 NOTE — Progress Notes (Signed)
Phone [phone] In person visit   Subjective:   Darren [name] is a 87 y.o. year old very pleasant male patient who presents for/with See problem oriented charting Chief Complaint  Patient presents with   Hyperlipidemia   This visit occurred during the SARS-CoV-2 public health emergency.  Safety protocols were in place, including screening questions prior to the visit, additional usage of staff PPE, and extensive cleaning of exam room while observing appropriate contact time as indicated for disinfecting solutions.   Past Medical History-  Patient Active Problem List   Diagnosis Date Noted   Parkinson disease (HCC) 11/27/2020    Priority: High   Essential hypertension 07/06/2021    Priority: Medium   Hyperlipidemia 11/27/2020    Priority: Medium   Gastroesophageal reflux disease without esophagitis 11/27/2020    Priority: Medium   History of CVA (cerebrovascular accident) 11/27/2020    Priority: Low    Medications- reviewed and updated Current Outpatient Medications  Medication Sig Dispense Refill   albuterol (VENTOLIN HFA) 108 (90 Base) MCG/ACT inhaler Inhale 2 puffs into the lungs every 6 (six) hours as needed for wheezing or shortness of breath. 1 each 2   amLODipine-benazepril (LOTREL) 10-20 MG capsule TAKE 1 CAPSULE BY MOUTH EVERY DAY 90 capsule 1   aspirin EC 81 MG tablet Take 81 mg by mouth daily. Swallow whole.     atorvastatin (LIPITOR) 40 MG tablet TAKE 1 TABLET BY MOUTH EVERY DAY 90 tablet 4   carbidopa-levodopa (SINEMET CR) 50-200 MG tablet TAKE 1 TABLET BY MOUTH AT BEDTIME 90 tablet 0   Cholecalciferol (VITAMIN D3 PO) Take 2 capsules by mouth daily.     fluticasone-salmeterol (ADVAIR) 100-50 MCG/ACT AEPB Inhale 1 puff into the lungs 2 (two) times daily. 1 each 11   omeprazole (PRILOSEC) 20 MG capsule TAKE 1 CAPSULE BY MOUTH EVERY DAY 90 capsule 3   QUEtiapine (SEROQUEL) 25 MG tablet Take 0.5 tablets (12.5 mg total) by mouth at bedtime. 45 tablet 1   No  current facility-administered medications for this visit.     Objective:  BP 135/68   Pulse 65   Temp 98.2 F (36.8 C) (Temporal)   Ht 5\' 10"  (1.778 m)   Wt 167 lb 9.6 oz (76 kg)   SpO2 96%   BMI 24.05 kg/m  Gen: NAD, resting comfortably CV: RRR no murmurs rubs or gallops Lungs: CTAB no crackles, wheeze, rhonchi Abdomen: soft/nontender/nondistended/normal bowel sounds. No rebound Ext: no edema on exam Skin: warm, dry Neuro: resting tremor noted    Assessment and Plan   # Parkinson's disease S:diagnosed  At least 2019. On carbidopa levodopa 50-200mg  at bedtime. Had neurologist in [place] that retired  - was also on propranolol for tremor - referral with Dr. CAHORS at neurology was placed on last visit to have insight on her expert opinion. She placed him on Seroquel 25 mg - 12.5 mg before bed. Slept much better on seroquel since starting A/P: Good improvement on Seroquel-thankful for excellent care by Dr. Arbutus Leas will continue current medications as listed above  # history of CVA S:was having left arm weakness and saw neurology before moving to [facility] in August 2021.  He was started on aspirin 81 mg at the time. Had previously been on atorvastatin 40 mg. Last cholesterol check in July 2021.  - Denied residual weakness A/P:no evidence of recurrence- continue to monitor  #hypertension S: medication: amlodipine-benazepril 10-20 mg in the morning BP Readings from Last 3 Encounters:  07/06/21  135/68  05/19/21 138/68  12/15/20 (!) 100/52  A/P: Stable. Continue current medications.  -creatinine slightly high- update CMP today  #hyperlipidemia S: Medication:atorvastatin 40 mg daily Lab Results  Component Value Date   CHOL 134 11/27/2020   HDL 59 11/27/2020   LDLCALC 58 11/27/2020   TRIG 91 11/27/2020   CHOLHDL 2.3 11/27/2020   A/P: ideal goal for someone with stroke history with LDL under 70- continue current rx  # GERD S:Medication: omeprazole 20 mg - we discussed the  option of trying Pepcid in the long-run and asked patient to consider this for today's visit  B12 levels related to PPI use: low normal in past- will try b12 once a week 1000 mcg Lab Results  Component Value Date   VITAMINB12 304 11/27/2020   A/P: Reflux is controlled but we discussed option of trying Pepcid over-the-counter (he declines)  Also discussed starting B12 once a week 1000 mcg-he did not get a chance to start trying this yet  # Asthma S: Maintenance Medication: possibly on advair as of last visit- he states came off and now using albuterol daily.  A/P: Poor control of asthma off of Advair-we will restart this.  Goal for albuterol would be twice a week or less-if he is using above that dose I asked him to let me know  # meloxicam for pain in neck in past- he thinks he may have stopped- he will double check with son who is pharmacist- I would prefer for him to be off of this for his long term kindey function- last Cr 1.59. when we stopped med prior to may labs- cr signfiicantly improved to 1.2- then tried 7.5 mg and was stable at 1.25- he think smay have stopped this completely- will check at home. Currently neck pain has been better- so would prefer to remian off  Recommended follow up: Return in about 6 months (around 01/06/2022) for follow-up or sooner if needed. Future Appointments  Date Time Provider Department Center  08/02/2021  2:00 PM Tat, Octaviano Batty, DO [facility] None   Lab/Order associations:   ICD-10-CM   1. Essential hypertension  I10 CBC with Differential/Platelet    Comprehensive metabolic panel    2. Hyperlipidemia, unspecified hyperlipidemia type  E78.5 CBC with Differential/Platelet    Comprehensive metabolic panel    3. Parkinson disease (HCC)  G20     4. History of CVA (cerebrovascular accident)  Z86.73     5. Gastroesophageal reflux disease without esophagitis  K21.9       Meds ordered this encounter  Medications   fluticasone-salmeterol (ADVAIR) 100-50  MCG/ACT AEPB    Sig: Inhale 1 puff into the lungs 2 (two) times daily.    Dispense:  1 each    Refill:  11    I,Harris Phan,acting as a scribe for Tana Conch, MD.,have documented all relevant documentation on the behalf of Tana Conch, MD,as directed by  Tana Conch, MD while in the presence of Tana Conch, MD.  I, Tana Conch, MD, have reviewed all documentation for this visit. The documentation on 07/06/21 for the exam, diagnosis, procedures, and orders are all accurate and complete.  Return precautions advised.  Tana Conch, MD

## 2021-07-07 ENCOUNTER — Other Ambulatory Visit: Payer: Self-pay

## 2021-07-07 DIAGNOSIS — D649 Anemia, unspecified: Secondary | ICD-10-CM

## 2021-07-09 ENCOUNTER — Other Ambulatory Visit: Payer: Self-pay | Admitting: Neurology

## 2021-07-15 ENCOUNTER — Other Ambulatory Visit: Payer: Medicare Other

## 2021-07-16 ENCOUNTER — Other Ambulatory Visit: Payer: Self-pay | Admitting: Family Medicine

## 2021-07-19 ENCOUNTER — Other Ambulatory Visit: Payer: Self-pay | Admitting: Neurology

## 2021-07-22 ENCOUNTER — Other Ambulatory Visit: Payer: Medicare Other

## 2021-07-29 ENCOUNTER — Other Ambulatory Visit: Payer: Self-pay

## 2021-07-29 ENCOUNTER — Other Ambulatory Visit (INDEPENDENT_AMBULATORY_CARE_PROVIDER_SITE_OTHER): Payer: Medicare Other

## 2021-07-29 ENCOUNTER — Encounter: Payer: Self-pay | Admitting: Family Medicine

## 2021-07-29 DIAGNOSIS — D649 Anemia, unspecified: Secondary | ICD-10-CM | POA: Diagnosis not present

## 2021-07-29 LAB — CBC WITH DIFFERENTIAL/PLATELET
Basophils Absolute: 0 10*3/uL (ref 0.0–0.1)
Basophils Relative: 0.5 % (ref 0.0–3.0)
Eosinophils Absolute: 0.1 10*3/uL (ref 0.0–0.7)
Eosinophils Relative: 2.2 % (ref 0.0–5.0)
HCT: 36.9 % — ABNORMAL LOW (ref 39.0–52.0)
Hemoglobin: 12.5 g/dL — ABNORMAL LOW (ref 13.0–17.0)
Lymphocytes Relative: 17.4 % (ref 12.0–46.0)
Lymphs Abs: 1 10*3/uL (ref 0.7–4.0)
MCHC: 33.9 g/dL (ref 30.0–36.0)
MCV: 95.4 fl (ref 78.0–100.0)
Monocytes Absolute: 0.5 10*3/uL (ref 0.1–1.0)
Monocytes Relative: 8.1 % (ref 3.0–12.0)
Neutro Abs: 4.1 10*3/uL (ref 1.4–7.7)
Neutrophils Relative %: 71.8 % (ref 43.0–77.0)
Platelets: 240 10*3/uL (ref 150.0–400.0)
RBC: 3.86 Mil/uL — ABNORMAL LOW (ref 4.22–5.81)
RDW: 13.2 % (ref 11.5–15.5)
WBC: 5.7 10*3/uL (ref 4.0–10.5)

## 2021-07-30 NOTE — Progress Notes (Signed)
Assessment/Plan:   1.  Parkinsons Disease, diagnosed 2018  -Continue carbidopa/levodopa 25/100, 2/1/1  -Continue carbidopa/levodopa 50/200 CR at bedtime  -discussed advanced directives - wants everything done  -wife has POA for healthcare and finances.  -they will let me know when want another referral for PT.  Was helpful but pt doesn't want to return right now.  2.  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  Last ultrasound was in March, 2022.  We will monitor this approximately yearly.  -On aspirin, 81 mg daily.  -On Lipitor, 40 mg daily.  LDL was at goal.  Primary care monitoring.  3.  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  He is not driving.  -Wife talked about trouble with caregiving in the home with new onset aggression and difficulty with sleep.  We discussed quetiapine.  We did talk about the fact that the atypical antipsychotic medications are not indicated for dementia related psychosis and increase risk of mortality in the elderly, usually because of infectious or  cardiac related etiologies.  We discussed prolongation of the QT interval and what that means.  QTC was normal about a year ago.  We discussed the black box warning in detail and how it applies in this case.  Family believes that QOL is important and they have tried nonpharmacologic therapies (maintaining schedule, attempting proper sleep hydration, redirecting, etc) without success.  They would like to continue to caregive in the home and believe that this is the only way, but worry that pt is a threat to self/others.  Increase quetiapine to 25 mg, 1 at night and can take extra 1/2 in the day if needed for agitation.    Subjective:   Darren [name] was seen today in follow up for Parkinsons disease.  My previous records were reviewed prior to todays visit as well as outside records available to me.  Last visit,  time he was started.  His wife sent me a message a few weeks later and stated that he was doing well on it.  She reports today that he is not sleeping well again because he is anxious all the time.  Saw Dr. Durene Cal on August 16.  Notes reviewed.  No falls.  PT was really helpful.   Current prescribed movement disorder medications: Carbidopa/levodopa 25/100, 2/1/1  Carbidopa/levodopa 50/200 CR at bedtime  Quetiapine, 12.5 mg at bedtime (started last visit   PREVIOUS MEDICATIONS: propranolol  ALLERGIES:  No Known Allergies  CURRENT MEDICATIONS:  Outpatient Encounter Medications as of 08/02/2021  Medication Sig   albuterol (VENTOLIN HFA) 108 (90 Base) MCG/ACT inhaler Inhale 2 puffs into the lungs every 6 (six) hours as needed for wheezing or shortness of breath.   amLODipine-benazepril (LOTREL) 10-20 MG capsule TAKE 1 CAPSULE BY MOUTH EVERY DAY   aspirin EC 81 MG tablet Take 81 mg by mouth daily. Swallow whole.   atorvastatin (LIPITOR) 40 MG tablet TAKE 1 TABLET BY MOUTH EVERY DAY   carbidopa-levodopa (SINEMET CR) 50-200 MG tablet TAKE 1 TABLET BY MOUTH AT BEDTIME   carbidopa-levodopa (SINEMET IR) 25-100 MG tablet TAKE 2 TABLETS BY MOUTH EVERY MORNING AT 8AM and TAKE 1 TABLET BY MOUTH AT NOON and TAKE 1 TABLET BY MOUTH AT 4PM (Patient taking differently: TAKE 2 TABLETS AT 8AM and TAKE 1 TABLET  NOON and TAKE 1 TABLET  AT 4PM)   Cholecalciferol (VITAMIN D3 PO) Take 2 capsules  by mouth daily.   fluticasone (FLONASE) 50 MCG/ACT nasal spray USE 2 SPRAYS in each nostril 2 TIMES DAILY   fluticasone-salmeterol (ADVAIR) 100-50 MCG/ACT AEPB Inhale 1 puff into the lungs 2 (two) times daily.   omeprazole (PRILOSEC) 20 MG capsule TAKE 1 CAPSULE BY MOUTH EVERY DAY   [DISCONTINUED] QUEtiapine (SEROQUEL) 25 MG tablet Take 0.5 tablets (12.5 mg total) by mouth at bedtime.   QUEtiapine (SEROQUEL) 25 MG tablet 1 at bedtime, 1/2 in day prn   [DISCONTINUED] carbidopa-levodopa (SINEMET CR) 50-200 MG tablet TAKE 1  TABLET BY MOUTH AT BEDTIME   No facility-administered encounter medications on file as of 08/02/2021.    Objective:   PHYSICAL EXAMINATION:    VITALS:   Vitals:   08/02/21 1340  BP: 123/63  Pulse: 82  SpO2: 96%  Weight: 166 lb 3.2 oz (75.4 kg)  Height: 5\' 11"  (1.803 m)     GEN:  The patient appears stated age and is in NAD. HEENT:  Normocephalic, atraumatic.  The mucous membranes are moist. The superficial temporal arteries are without ropiness or tenderness. CV:  RRR Lungs:  CTAB Neck/HEME:  There are no carotid bruits bilaterally.  Neurological examination:  Orientation: The patient is alert and oriented to person Cranial nerves: There is good facial symmetry with facial hypomimia. The speech is fluent and clear. Soft palate rises symmetrically and there is no tongue deviation. Hearing is intact to conversational tone. Sensation: Sensation is intact to light touch throughout Motor: Strength is at least antigravity x4.  Movement examination: Tone: There is nl tone in the ue/le Abnormal movements: none today Coordination:  There is only apraxia on the L with RAMs and not much decremation Gait and Station: The patients stride length is decreased.  Ambulates with cane   I have reviewed and interpreted the following labs independently    Chemistry      Component Value Date/Time   NA 142 07/06/2021 1153   K 4.5 07/06/2021 1153   CL 107 07/06/2021 1153   CO2 28 07/06/2021 1153   BUN 27 (H) 07/06/2021 1153   CREATININE 1.23 07/06/2021 1153   CREATININE 1.59 (H) 11/27/2020 1514      Component Value Date/Time   CALCIUM 9.4 07/06/2021 1153   ALKPHOS 61 07/06/2021 1153   AST 18 07/06/2021 1153   ALT 3 07/06/2021 1153   BILITOT 0.6 07/06/2021 1153       Lab Results  Component Value Date   WBC 5.7 07/29/2021   HGB 12.5 (L) 07/29/2021   HCT 36.9 (L) 07/29/2021   MCV 95.4 07/29/2021   PLT 240.0 07/29/2021    No results found for: TSH   Cc:  09/28/2021, MD

## 2021-08-01 ENCOUNTER — Encounter: Payer: Self-pay | Admitting: Family Medicine

## 2021-08-02 ENCOUNTER — Encounter: Payer: Self-pay | Admitting: Neurology

## 2021-08-02 ENCOUNTER — Other Ambulatory Visit: Payer: Self-pay

## 2021-08-02 ENCOUNTER — Ambulatory Visit (INDEPENDENT_AMBULATORY_CARE_PROVIDER_SITE_OTHER): Payer: Medicare Other | Admitting: Neurology

## 2021-08-02 ENCOUNTER — Other Ambulatory Visit: Payer: Self-pay | Admitting: Neurology

## 2021-08-02 VITALS — BP 123/63 | HR 82 | Ht 71.0 in | Wt 166.2 lb

## 2021-08-02 DIAGNOSIS — G20 Parkinson's disease: Secondary | ICD-10-CM

## 2021-08-02 DIAGNOSIS — I63411 Cerebral infarction due to embolism of right middle cerebral artery: Secondary | ICD-10-CM | POA: Diagnosis not present

## 2021-08-02 DIAGNOSIS — F0281 Dementia in other diseases classified elsewhere with behavioral disturbance: Secondary | ICD-10-CM | POA: Diagnosis not present

## 2021-08-02 DIAGNOSIS — F02818 Dementia in other diseases classified elsewhere, unspecified severity, with other behavioral disturbance: Secondary | ICD-10-CM

## 2021-08-02 MED ORDER — QUETIAPINE FUMARATE 25 MG PO TABS: ORAL_TABLET | ORAL | 1 refills | Status: DC

## 2021-08-02 NOTE — Patient Instructions (Signed)
Increase quetiapine to 25 mg, 1 tablet at night and then can take 1/2 tablet in day if agitated/anxious.  Let me know if you want another order for Physical Therapy.  The physicians and staff at [facility] are committed to providing excellent care. You may receive a survey requesting feedback about your experience at our office. We strive to receive "very good" responses to the survey questions. If you feel that your experience would prevent you from giving the office a "very good " response, please contact our office to try to remedy the situation. We may be reached at [phone]. Thank you for taking the time out of your busy day to complete the survey.

## 2021-08-03 ENCOUNTER — Other Ambulatory Visit: Payer: Self-pay | Admitting: Family Medicine

## 2021-08-04 ENCOUNTER — Other Ambulatory Visit: Payer: Self-pay

## 2021-08-04 ENCOUNTER — Other Ambulatory Visit: Payer: Medicare Other

## 2021-08-04 DIAGNOSIS — D649 Anemia, unspecified: Secondary | ICD-10-CM

## 2021-08-04 LAB — FECAL OCCULT BLOOD, IMMUNOCHEMICAL: Fecal Occult Bld: NEGATIVE

## 2021-10-27 ENCOUNTER — Other Ambulatory Visit: Payer: Self-pay | Admitting: Family Medicine

## 2021-11-19 ENCOUNTER — Other Ambulatory Visit: Payer: Self-pay

## 2021-11-19 ENCOUNTER — Ambulatory Visit (INDEPENDENT_AMBULATORY_CARE_PROVIDER_SITE_OTHER): Payer: Medicare Other

## 2021-11-19 DIAGNOSIS — Z0000 Encounter for general adult medical examination without abnormal findings: Secondary | ICD-10-CM

## 2021-11-19 NOTE — Progress Notes (Addendum)
Virtual Visit via Telephone Note  I connected with  Darren [name] on 11/19/21 at 10:15 AM EST by telephone and verified that I am speaking with the correct person using two identifiers.  Medicare Annual Wellness visit completed telephonically due to Covid-19 pandemic.   Persons participating in this call: This Health Coach and this patient.   Location: Patient: Home Provider: Office   I discussed the limitations, risks, security and privacy concerns of performing an evaluation and management service by telephone and the availability of in person appointments. The patient expressed understanding and agreed to proceed.  Unable to perform video visit due to video visit attempted and failed and/or patient does not have video capability.   Some vital signs may be absent or patient reported.   Marzella Schlein, LPN   Subjective:   Darren [name] is a 87 y.o. male who presents for an Initial Medicare Annual Wellness Visit.  Review of Systems     Cardiac Risk Factors include: advanced age (>24men, >44 women);dyslipidemia;male gender;hypertension     Objective:    Today's Vitals   11/19/21 0955  PainSc: 5    There is no height or weight on file to calculate BMI.  Advanced Directives 11/19/2021 08/02/2021 05/19/2021 12/15/2020  Does Patient Have a Medical Advance Directive? Yes Yes Yes Yes  Type of Estate agent of [place];Living will - Healthcare Power of [place];Out of facility DNR (pink MOST or yellow form);Living will Healthcare Power of [place];Living will  Copy of Healthcare Power of Attorney in Chart? No - copy requested - - -    Current Medications (verified) Outpatient Encounter Medications as of 11/19/2021  Medication Sig   albuterol (VENTOLIN HFA) 108 (90 Base) MCG/ACT inhaler INHALE 2 PUFFS INTO THE LUNGS EVERY 6 HOURS AS NEEDED FOR WHEEZING OR SHORTNESS OF BREATH   amLODipine-benazepril (LOTREL) 10-20 MG capsule TAKE 1 CAPSULE BY MOUTH EVERY DAY    aspirin EC 81 MG tablet Take 81 mg by mouth daily. Swallow whole.   atorvastatin (LIPITOR) 40 MG tablet TAKE 1 TABLET BY MOUTH EVERY DAY   carbidopa-levodopa (SINEMET CR) 50-200 MG tablet TAKE 1 TABLET BY MOUTH AT BEDTIME   carbidopa-levodopa (SINEMET IR) 25-100 MG tablet TAKE 2 TABLETS BY MOUTH EVERY MORNING AT 8AM and TAKE 1 TABLET BY MOUTH AT NOON and TAKE 1 TABLET BY MOUTH AT 4PM (Patient taking differently: TAKE 2 TABLETS AT 8AM and TAKE 1 TABLET  NOON and TAKE 1 TABLET  AT 4PM)   Cholecalciferol (VITAMIN D3 PO) Take 2 capsules by mouth daily.   fluticasone (FLONASE) 50 MCG/ACT nasal spray USE 2 SPRAYS in each nostril 2 TIMES DAILY   fluticasone-salmeterol (ADVAIR) 100-50 MCG/ACT AEPB Inhale 1 puff into the lungs 2 (two) times daily.   omeprazole (PRILOSEC) 20 MG capsule TAKE 1 CAPSULE BY MOUTH EVERY DAY   QUEtiapine (SEROQUEL) 25 MG tablet 1 at bedtime, 1/2 in day prn   No facility-administered encounter medications on file as of 11/19/2021.    Allergies (verified) Patient has no known allergies.   History: Past Medical History:  Diagnosis Date   Asthma    Cataract    Stroke [facility])    Past Surgical History:  Procedure Laterality Date   APPENDECTOMY     cataract surgery- both eyes     CHOLECYSTECTOMY     Family History  Problem Relation Age of Onset   Heart attack Mother        36   Heart attack Father  65   Cancer Brother        unknown type   Bipolar disorder Daughter    Arthritis Sister        back surgery and complications- ongoing antibiotics   Social History   Socioeconomic History   Marital status: Married    Spouse name: Not on file   Number of children: Not on file   Years of education: Not on file   Highest education level: Not on file  Occupational History   Not on file  Tobacco Use   Smoking status: Former    Types: Cigarettes, Pipe   Smokeless tobacco: Never   Tobacco comments:    quit prior to 1972  Vaping Use   Vaping Use: Never  used  Substance and Sexual Activity   Alcohol use: Not Currently    Alcohol/week: 0.0 standard drinks   Drug use: Not Currently   Sexual activity: Not Currently  Other Topics Concern   Not on file  Social History Narrative   Married. 2 amazing children- son pharmacist /lead pharmacist at [employer]. Son with 4 children, daughter with 0.       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 back age 21      Hobies: golf until [employer], enjoys puzzles in newspaper, sodoku, tries to stay active   Social Determinants of Corporate investment banker Strain: Low Risk    Difficulty of Paying Living Expenses: Not hard at all  Food Insecurity: No Food Insecurity   Worried About Programme researcher, broadcasting/film/video in the Last Year: Never true   Barista in the Last Year: Never true  Transportation Needs: No Transportation Needs   Lack of Transportation (Medical): No   Lack of Transportation (Non-Medical): No  Physical Activity: Inactive   Days of Exercise per Week: 0 days   Minutes of Exercise per Session: 0 min  Stress: No Stress Concern Present   Feeling of Stress : Not at all  Social Connections: Moderately Isolated   Frequency of Communication with Friends and Family: More than three times a week   Frequency of Social Gatherings with Friends and Family: Twice a week   Attends Religious Services: Never   Database administrator or Organizations: No   Attends Engineer, structural: Never   Marital Status: Married    Tobacco Counseling Counseling given: Not Answered Tobacco comments: quit prior to 1972   Clinical Intake:  Pre-visit preparation completed: Yes  Pain : 0-10 Pain Score: 5  Pain Type: Chronic pain Pain Location: Back Pain Onset: More than a month ago Pain Frequency: Intermittent     BMI - recorded: 23.19 Nutritional Status: BMI of 19-24  Normal Nutritional Risks: None Diabetes: No  How often do you need to have  someone help you when you read instructions, pamphlets, or other written materials from your doctor or pharmacy?: 1 - Never  Diabetic?No  Interpreter Needed?: No  Information entered by :: Lanier Ensign, LPN   Activities of Daily Living In your present state of health, do you have any difficulty performing the following activities: 11/19/2021 11/27/2020  Hearing? N N  Vision? N N  Difficulty concentrating or making decisions? Y Y  Comment at times sometimes he has so slight issues  Walking or climbing stairs? Y Y  Comment more walking down than up -  Dressing or bathing? N N  Doing errands, shopping? N Y  Quarry manager and  eating ? N -  Using the Toilet? N -  In the past six months, have you accidently leaked urine? N -  Do you have problems with loss of bowel control? N -  Managing your Medications? Y -  Comment son assist with medications -  Managing your Finances? N -  Housekeeping or managing your Housekeeping? N -  Some recent data might be hidden    Patient Care Team: [name], MD as PCP - General (Family Medicine) Tat, Octaviano Batty, DO as Consulting Physician (Neurology)  Indicate any recent Medical Services you may have received from other than [facility] providers in the past year (date may be approximate).     Assessment:   This is a routine wellness examination for Darren [name].  Hearing/Vision screen Hearing Screening - Comments:: Pt denies any hearing issues  Vision Screening - Comments:: Pt follows up with Dr Dione Booze for annual eye exams   Dietary issues and exercise activities discussed: Current Exercise Habits: The patient does not participate in regular exercise at present   Goals Addressed             This Visit's Progress    Patient Stated       None at this time        Depression Screen PHQ 2/9 Scores 11/19/2021 07/06/2021 11/27/2020  PHQ - 2 Score 0 0 0    Fall Risk Fall Risk  11/19/2021 08/02/2021 07/06/2021 05/19/2021 12/15/2020  Falls in the  past year? 0 0 0 0 0  Number falls in past yr: 0 0 0 0 0  Injury with Fall? 0 0 0 0 0  Risk for fall due to : Impaired vision - No Fall Risks - -  Follow up Falls prevention discussed - Falls evaluation completed - -    FALL RISK PREVENTION PERTAINING TO THE HOME:  Any stairs in or around the home? No  If so, are there any without handrails? No  Home free of loose throw rugs in walkways, pet beds, electrical cords, etc? Yes  Adequate lighting in your home to reduce risk of falls? Yes   ASSISTIVE DEVICES UTILIZED TO PREVENT FALLS:  Life alert? Yes pull cord  Use of a cane, walker or w/c? No  Grab bars in the bathroom? Yes  Shower chair or bench in shower? Yes  Elevated toilet seat or a handicapped toilet? Yes   TIMED UP AND GO:  Was the test performed? No .   Cognitive Function:     6CIT Screen 11/19/2021  What Year? 4 points  What month? 3 points  What time? 3 points  Count back from 20 0 points  Months in reverse 4 points  Repeat phrase 10 points  Total Score 24    Immunizations Immunization History  Administered Date(s) Administered   Influenza-Unspecified 08/05/2021   Moderna SARS-COV2 Booster Vaccination 09/01/2020, 03/24/2021   Moderna Sars-Covid-2 Vaccination 12/11/2019, 01/08/2020   Pfizer Covid-19 Vaccine Bivalent Booster 59yrs & up 08/05/2021   Zoster Recombinat (Shingrix) 08/11/2021    TDAP status: Due, Education has been provided regarding the importance of this vaccine. Advised may receive this vaccine at local pharmacy or Health Dept. Aware to provide a copy of the vaccination record if obtained from local pharmacy or Health Dept. Verbalized acceptance and understanding.  Flu Vaccine status: Up to date  Pneumococcal vaccine status: Due, Education has been provided regarding the importance of this vaccine. Advised may receive this vaccine at local pharmacy or Health Dept. Aware to  provide a copy of the vaccination record if obtained from local pharmacy  or Health Dept. Verbalized acceptance and understanding.  Covid-19 vaccine status: Completed vaccines  Qualifies for Shingles Vaccine? Yes   Zostavax completed Yes   Shingrix Completed?: Yes  Screening Tests Health Maintenance  Topic Date Due   Pneumonia Vaccine 45+ Years old (1 - PCV) Never done   TETANUS/TDAP  Never done   COVID-19 Vaccine (4 - Booster) 09/30/2021   Zoster Vaccines- Shingrix (2 of 2) 10/06/2021   INFLUENZA VACCINE  Completed   HPV VACCINES  Aged Out    Health Maintenance  Health Maintenance Due  Topic Date Due   Pneumonia Vaccine 66+ Years old (1 - PCV) Never done   TETANUS/TDAP  Never done   COVID-19 Vaccine (4 - Booster) 09/30/2021   Zoster Vaccines- Shingrix (2 of 2) 10/06/2021    Colorectal cancer screening: No longer required.    Additional Screening:   Vision Screening: Recommended annual ophthalmology exams for early detection of glaucoma and other disorders of the eye. Is the patient up to date with their annual eye exam?  Yes  Who is the provider or what is the name of the office in which the patient attends annual eye exams? Dr Dione Booze  If pt is not established with a provider, would they like to be referred to a provider to establish care? No .   Dental Screening: Recommended annual dental exams for proper oral hygiene  Community Resource Referral / Chronic Care Management: CRR required this visit?  No   CCM required this visit?  No      Plan:     I have personally reviewed and noted the following in the patients chart:   Medical and social history Use of alcohol, tobacco or illicit drugs  Current medications and supplements including opioid prescriptions. Patient is not currently taking opioid prescriptions. Functional ability and status Nutritional status Physical activity Advanced directives List of other physicians Hospitalizations, surgeries, and ER visits in previous 12 months Vitals Screenings to include cognitive,  depression, and falls Referrals and appointments  In addition, I have reviewed and discussed with patient certain preventive protocols, quality metrics, and best practice recommendations. A written personalized care plan for preventive services as well as general preventive health recommendations were provided to patient.     Marzella Schlein, LPN   [id number]   Nurse Notes: none

## 2021-11-19 NOTE — Patient Instructions (Signed)
Darren [name] , Thank you for taking time to come for your Medicare Wellness Visit. I appreciate your ongoing commitment to your health goals. Please review the following plan we discussed and let me know if I can assist you in the future.   Screening recommendations/referrals: Colonoscopy: No longer required  Recommended yearly ophthalmology/optometry visit for glaucoma screening and checkup Recommended yearly dental visit for hygiene and checkup  Vaccinations: Influenza vaccine: Done 08/05/21 repeat every year  Pneumococcal vaccine: Due and discussed  Tdap vaccine: due and discussed  Shingles vaccine: 1st dose 08/11/21   Covid-19: Completed 1/20, 2/17, 09/01/20 & 5/4 & 08/05/21  Advanced directives: Please bring a copy of your health care power of attorney and living will to the office at your convenience.  Conditions/risks identified: None at this time  Next appointment: Follow up in one year for your annual wellness visit.   Preventive Care 45 Years and Older, Male Preventive care refers to lifestyle choices and visits with your health care provider that can promote health and wellness. What does preventive care include? A yearly physical exam. This is also called an annual well check. Dental exams once or twice a year. Routine eye exams. Ask your health care provider how often you should have your eyes checked. Personal lifestyle choices, including: Daily care of your teeth and gums. Regular physical activity. Eating a healthy diet. Avoiding tobacco and drug use. Limiting alcohol use. Practicing safe sex. Taking low doses of aspirin every day. Taking vitamin and mineral supplements as recommended by your health care provider. What happens during an annual well check? The services and screenings done by your health care provider during your annual well check will depend on your age, overall health, lifestyle risk factors, and family history of disease. Counseling  Your health care  provider may ask you questions about your: Alcohol use. Tobacco use. Drug use. Emotional well-being. Home and relationship well-being. Sexual activity. Eating habits. History of falls. Memory and ability to understand (cognition). Work and work Astronomer. Screening  You may have the following tests or measurements: Height, weight, and BMI. Blood pressure. Lipid and cholesterol levels. These may be checked every 5 years, or more frequently if you are over 32 years old. Skin check. Lung cancer screening. You may have this screening every year starting at age 78 if you have a 30-pack-year history of smoking and currently smoke or have quit within the past 15 years. Fecal occult blood test (FOBT) of the stool. You may have this test every year starting at age 63. Flexible sigmoidoscopy or colonoscopy. You may have a sigmoidoscopy every 5 years or a colonoscopy every 10 years starting at age 53. Prostate cancer screening. Recommendations will vary depending on your family history and other risks. Hepatitis C blood test. Hepatitis B blood test. Sexually transmitted disease (STD) testing. Diabetes screening. This is done by checking your blood sugar (glucose) after you have not eaten for a while (fasting). You may have this done every 1-3 years. Abdominal aortic aneurysm (AAA) screening. You may need this if you are a current or former smoker. Osteoporosis. You may be screened starting at age 82 if you are at high risk. Talk with your health care provider about your test results, treatment options, and if necessary, the need for more tests. Vaccines  Your health care provider may recommend certain vaccines, such as: Influenza vaccine. This is recommended every year. Tetanus, diphtheria, and acellular pertussis (Tdap, Td) vaccine. You may need a Td booster every 10  years. Zoster vaccine. You may need this after age 53. Pneumococcal 13-valent conjugate (PCV13) vaccine. One dose is  recommended after age 28. Pneumococcal polysaccharide (PPSV23) vaccine. One dose is recommended after age 46. Talk to your health care provider about which screenings and vaccines you need and how often you need them. This information is not intended to replace advice given to you by your health care provider. Make sure you discuss any questions you have with your health care provider. Document Released: 12/04/2015 Document Revised: 07/27/2016 Document Reviewed: 09/08/2015 Elsevier Interactive Patient Education  2017 [place] Prevention in the Home Falls can cause injuries. They can happen to people of all ages. There are many things you can do to make your home safe and to help prevent falls. What can I do on the outside of my home? Regularly fix the edges of walkways and driveways and fix any cracks. Remove anything that might make you trip as you walk through a door, such as a raised step or threshold. Trim any bushes or trees on the path to your home. Use bright outdoor lighting. Clear any walking paths of anything that might make someone trip, such as rocks or tools. Regularly check to see if handrails are loose or broken. Make sure that both sides of any steps have handrails. Any raised decks and porches should have guardrails on the edges. Have any leaves, snow, or ice cleared regularly. Use sand or salt on walking paths during winter. Clean up any spills in your garage right away. This includes oil or grease spills. What can I do in the bathroom? Use night lights. Install grab bars by the toilet and in the tub and shower. Do not use towel bars as grab bars. Use non-skid mats or decals in the tub or shower. If you need to sit down in the shower, use a plastic, non-slip stool. Keep the floor dry. Clean up any water that spills on the floor as soon as it happens. Remove soap buildup in the tub or shower regularly. Attach bath mats securely with double-sided non-slip rug  tape. Do not have throw rugs and other things on the floor that can make you trip. What can I do in the bedroom? Use night lights. Make sure that you have a light by your bed that is easy to reach. Do not use any sheets or blankets that are too big for your bed. They should not hang down onto the floor. Have a firm chair that has side arms. You can use this for support while you get dressed. Do not have throw rugs and other things on the floor that can make you trip. What can I do in the kitchen? Clean up any spills right away. Avoid walking on wet floors. Keep items that you use a lot in easy-to-reach places. If you need to reach something above you, use a strong step stool that has a grab bar. Keep electrical cords out of the way. Do not use floor polish or wax that makes floors slippery. If you must use wax, use non-skid floor wax. Do not have throw rugs and other things on the floor that can make you trip. What can I do with my stairs? Do not leave any items on the stairs. Make sure that there are handrails on both sides of the stairs and use them. Fix handrails that are broken or loose. Make sure that handrails are as long as the stairways. Check any carpeting to make sure  that it is firmly attached to the stairs. Fix any carpet that is loose or worn. Avoid having throw rugs at the top or bottom of the stairs. If you do have throw rugs, attach them to the floor with carpet tape. Make sure that you have a light switch at the top of the stairs and the bottom of the stairs. If you do not have them, ask someone to add them for you. What else can I do to help prevent falls? Wear shoes that: Do not have high heels. Have rubber bottoms. Are comfortable and fit you well. Are closed at the toe. Do not wear sandals. If you use a stepladder: Make sure that it is fully opened. Do not climb a closed stepladder. Make sure that both sides of the stepladder are locked into place. Ask someone to  hold it for you, if possible. Clearly mark and make sure that you can see: Any grab bars or handrails. First and last steps. Where the edge of each step is. Use tools that help you move around (mobility aids) if they are needed. These include: Canes. Walkers. Scooters. Crutches. Turn on the lights when you go into a dark area. Replace any light bulbs as soon as they burn out. Set up your furniture so you have a clear path. Avoid moving your furniture around. If any of your floors are uneven, fix them. If there are any pets around you, be aware of where they are. Review your medicines with your doctor. Some medicines can make you feel dizzy. This can increase your chance of falling. Ask your doctor what other things that you can do to help prevent falls. This information is not intended to replace advice given to you by your health care provider. Make sure you discuss any questions you have with your health care provider. Document Released: 09/03/2009 Document Revised: 04/14/2016 Document Reviewed: 12/12/2014 Elsevier Interactive Patient Education  2017 [employer].

## 2021-11-25 ENCOUNTER — Telehealth (INDEPENDENT_AMBULATORY_CARE_PROVIDER_SITE_OTHER): Payer: Medicare Other | Admitting: Family Medicine

## 2021-11-25 ENCOUNTER — Encounter: Payer: Self-pay | Admitting: Family Medicine

## 2021-11-25 VITALS — Temp 98.7°F | Ht 71.0 in | Wt 160.0 lb

## 2021-11-25 DIAGNOSIS — U071 COVID-19: Secondary | ICD-10-CM | POA: Diagnosis not present

## 2021-11-25 DIAGNOSIS — I10 Essential (primary) hypertension: Secondary | ICD-10-CM | POA: Diagnosis not present

## 2021-11-25 DIAGNOSIS — E785 Hyperlipidemia, unspecified: Secondary | ICD-10-CM | POA: Diagnosis not present

## 2021-11-25 MED ORDER — MOLNUPIRAVIR EUA 200MG CAPSULE: 4.0000 | ORAL_CAPSULE | Freq: Two times a day (BID) | ORAL | 0 refills | Status: AC

## 2021-11-25 NOTE — Patient Instructions (Signed)
Health Maintenance Due  Topic Date Due   Pneumonia Vaccine 75+ Years old (1 - PCV) Never done   TETANUS/TDAP  Never done   COVID-19 Vaccine (4 - Booster) 09/30/2021   Zoster Vaccines- Shingrix (2 of 2) 10/06/2021   Hold off all immunizations until you start to feel better!  Recommended follow up: No follow-ups on file.

## 2021-11-25 NOTE — Progress Notes (Signed)
Phone [phone] Virtual visit via Video note   Subjective:  Chief complaint: Chief Complaint  Patient presents with   Follow-up    Pt tested positive for covid as of yesterday, he has sore throat and cough w/drainage, denies fever and appetite is fine.    This visit type was conducted due to national recommendations for restrictions regarding the COVID-19 Pandemic (e.g. social distancing).  This format is felt to be most appropriate for this patient at this time balancing risks to patient and risks to population by having him in for in person visit.  No physical exam was performed (except for noted visual exam or audio findings with Telehealth visits).    Our team/I connected with Darren [name]  at  2:20 PM EST by a video enabled telemedicine application ([email or web address] or caregility through epic) and verified that I am speaking with the correct person using two identifiers.  Location patient: [place] Location provider: [facility], office Persons participating in the virtual visit:  patient  Our team/I discussed the limitations of evaluation and management by telemedicine and the availability of in person appointments. In light of current covid-19 pandemic, patient also understands that we are trying to protect them by minimizing in office contact if at all possible.  The patient expressed consent for telemedicine visit and agreed to proceed. Patient understands insurance will be billed.   Past Medical History-  Patient Active Problem List   Diagnosis Date Noted   Parkinson disease (HCC) 11/27/2020    Priority: High   Essential hypertension 07/06/2021    Priority: Medium    Hyperlipidemia 11/27/2020    Priority: Medium    Gastroesophageal reflux disease without esophagitis 11/27/2020    Priority: Medium    History of CVA (cerebrovascular accident) 11/27/2020    Priority: Low    Medications- reviewed and updated Current Outpatient Medications  Medication Sig Dispense Refill    albuterol (VENTOLIN HFA) 108 (90 Base) MCG/ACT inhaler INHALE 2 PUFFS INTO THE LUNGS EVERY 6 HOURS AS NEEDED FOR WHEEZING OR SHORTNESS OF BREATH 8.5 g 2   amLODipine-benazepril (LOTREL) 10-20 MG capsule TAKE 1 CAPSULE BY MOUTH EVERY DAY 90 capsule 1   aspirin EC 81 MG tablet Take 81 mg by mouth daily. Swallow whole.     atorvastatin (LIPITOR) 40 MG tablet TAKE 1 TABLET BY MOUTH EVERY DAY 90 tablet 4   carbidopa-levodopa (SINEMET CR) 50-200 MG tablet TAKE 1 TABLET BY MOUTH AT BEDTIME 90 tablet 1   carbidopa-levodopa (SINEMET IR) 25-100 MG tablet TAKE 2 TABLETS BY MOUTH EVERY MORNING AT 8AM and TAKE 1 TABLET BY MOUTH AT NOON and TAKE 1 TABLET BY MOUTH AT 4PM (Patient taking differently: TAKE 2 TABLETS AT 8AM and TAKE 1 TABLET  NOON and TAKE 1 TABLET  AT 4PM) 360 tablet 1   Cholecalciferol (VITAMIN D3 PO) Take 2 capsules by mouth daily.     fluticasone (FLONASE) 50 MCG/ACT nasal spray USE 2 SPRAYS in each nostril 2 TIMES DAILY 16 g 0   fluticasone-salmeterol (ADVAIR) 100-50 MCG/ACT AEPB Inhale 1 puff into the lungs 2 (two) times daily. 1 each 11   molnupiravir EUA (LAGEVRIO) 200 mg CAPS capsule Take 4 capsules (800 mg total) by mouth 2 (two) times daily for 5 days. 40 capsule 0   omeprazole (PRILOSEC) 20 MG capsule TAKE 1 CAPSULE BY MOUTH EVERY DAY 90 capsule 3   QUEtiapine (SEROQUEL) 25 MG tablet 1 at bedtime, 1/2 in day prn 135 tablet 1   No current facility-administered medications  for this visit.     Objective:  Temp 98.7 F (37.1 C)    Ht 5\' 11"  (1.803 m)    Wt 160 lb (72.6 kg)    BMI 22.32 kg/m  self reported vitals Gen: NAD, resting comfortably Lungs: nonlabored, normal respiratory rate  Skin: appears dry, no obvious rash     Assessment and Plan   #COVID-19 Positive Infection S:Patient reports  flu like symptoms starting yesterday- tested positive for covid yesterday. Feeling somewhat better today- sore throat, cough with drainage. No fevers. Eating ok and drinking ok. Tylenol was  very helpful- no body aches at this time.  A/P: Patient with testing confirming covid 19 with first day of covid 19 symptoms- 11/24/21 Vaccination status: fully vaccinated including bivalent booster.    Therefore: - recommended patient watch closely for shortness of breath or confusion or worsening symptoms and if those occur patient should contact 01/22/22 immediately or seek care in the emergency department -recommended patient consider purchasing pulse oximeter and if levels 94% or below persistently- seek care at the hospital - Patient needs to self isolate  for at least 5 days since first symptom AND at least 24 hours fever free without fever reducing medications AND have improvement in respiratory symptoms . After 5 days can end self isolation but still needs to wear mask for additional 5 days .  -advised pushing fluids aggressively during this time -Patient should inform close contacts about exposure (anyone patient been around unmasked for more than 15 minutes)   If High risk for complications-we discussed outpatient therapeutic options including paxlovid (and risk of rebound), molnupiravir - patient opted for molnupiravir in shared decision making- this way will not need to alter other medications  #hypertension S: medication: amlodipine-benazepril 10-20 mg daily BP Readings from Last 3 Encounters:  08/02/21 123/63  07/06/21 135/68  05/19/21 138/68  A/P: traditionally controlled- continue current meds - discussed holding or at least halving meds if trouble keeping fluids down with illness  #hyperlipidemia with history CVA S: Medication:atorvastatin 40 mg daily and aspirin 81 mg daily  Lab Results  Component Value Date   CHOL 134 11/27/2020   HDL 59 11/27/2020   LDLCALC 58 11/27/2020   TRIG 91 11/27/2020   CHOLHDL 2.3 11/27/2020   A/P: once again so as to avoid medication changes we opted for molnupiravir (will not need to hold statin or adjust seroquel). Has been well controlled-  continue current meds -prefer to keep on statin with stroke history  Recommended follow up:  Future Appointments  Date Time Provider Department Center  01/13/2022 10:00 AM 01/15/2022, MD [facility]  02/03/2022  2:30 PM Tat, 02/05/2022, DO [facility] None  11/22/2022 10:15 AM [facility] HEALTH COACH [facility]   Lab/Order associations:   ICD-10-CM   1. COVID-19  U07.1     2. Essential hypertension  I10     3. Hyperlipidemia, unspecified hyperlipidemia type  E78.5      Meds ordered this encounter  Medications   molnupiravir EUA (LAGEVRIO) 200 mg CAPS capsule    Sig: Take 4 capsules (800 mg total) by mouth 2 (two) times daily for 5 days.    Dispense:  40 capsule    Refill:  0   Return precautions advised.  01/21/2023, MD

## 2021-12-10 ENCOUNTER — Other Ambulatory Visit: Payer: Self-pay | Admitting: Family Medicine

## 2022-01-03 NOTE — Progress Notes (Signed)
Phone [phone] In person visit   Subjective:   Darren [name] is a 88 y.o. year old very pleasant male patient who presents for/with See problem oriented charting Chief Complaint  Patient presents with   Follow-up   Hyperlipidemia   Hypertension   Gastroesophageal Reflux   Asthma   This visit occurred during the SARS-CoV-2 public health emergency.  Safety protocols were in place, including screening questions prior to the visit, additional usage of staff PPE, and extensive cleaning of exam room while observing appropriate contact time as indicated for disinfecting solutions.   Past Medical History-  Patient Active Problem List   Diagnosis Date Noted   Parkinson disease (HCC) 11/27/2020    Priority: High   Essential hypertension 07/06/2021    Priority: Medium    Hyperlipidemia 11/27/2020    Priority: Medium    Gastroesophageal reflux disease without esophagitis 11/27/2020    Priority: Medium    History of CVA (cerebrovascular accident) 11/27/2020    Priority: Low    Medications- reviewed and updated Current Outpatient Medications  Medication Sig Dispense Refill   albuterol (VENTOLIN HFA) 108 (90 Base) MCG/ACT inhaler INHALE 2 PUFFS INTO THE LUNGS EVERY 6 HOURS AS NEEDED FOR WHEEZING OR SHORTNESS OF BREATH 8.5 g 2   amLODipine-benazepril (LOTREL) 10-20 MG capsule TAKE 1 CAPSULE BY MOUTH EVERY DAY 90 capsule 1   ASPIRIN LOW DOSE 81 MG EC tablet TAKE 1 TABLET BY MOUTH EVERY DAY 90 tablet 3   atorvastatin (LIPITOR) 40 MG tablet TAKE 1 TABLET BY MOUTH EVERY DAY 90 tablet 4   carbidopa-levodopa (SINEMET CR) 50-200 MG tablet TAKE 1 TABLET BY MOUTH AT BEDTIME 90 tablet 1   carbidopa-levodopa (SINEMET IR) 25-100 MG tablet TAKE 2 TABLETS AT 8AM and TAKE 1 TABLET  NOON and TAKE 1 TABLET  AT 4PM 360 tablet 1   Cholecalciferol (VITAMIN D3 PO) Take 2 capsules by mouth daily.     D3 SUPER STRENGTH 50 MCG (2000 UT) CAPS TAKE 2 CAPSULES BY MOUTH EVERY DAY 180 capsule 3   fluticasone  (FLONASE) 50 MCG/ACT nasal spray USE 2 SPRAYS in each nostril 2 TIMES DAILY 16 g 0   fluticasone-salmeterol (ADVAIR) 100-50 MCG/ACT AEPB Inhale 1 puff into the lungs 2 (two) times daily. 1 each 11   omeprazole (PRILOSEC) 20 MG capsule TAKE 1 CAPSULE BY MOUTH EVERY DAY 90 capsule 3   QUEtiapine (SEROQUEL) 25 MG tablet 1 at bedtime, 1/2 in day prn 135 tablet 1   No current facility-administered medications for this visit.     Objective:  BP (!) 122/58    Pulse 65    Temp 98 F (36.7 C)    Ht 5\' 10"  (1.778 m)    Wt 163 lb 9.6 oz (74.2 kg)    SpO2 94%    BMI 23.47 kg/m  Gen: NAD, resting comfortably CV: RRR no murmurs rubs or gallops Lungs: CTAB no crackles, wheeze, rhonchi Ext: trace edema Skin: warm, dry Neuro: known parkinsons    Assessment and Plan   # Parkinson's disease S:diagnosed  At least 2019. On carbidopa levodopa  CR 50-200mg  at bedtime. Had neurologist in [place] that retired.  Also now on Sinemet instant release 25-100 mg 2 tablets in the a.m., 1 tablet at noon and 1 tablet at 4 PM  - was also on propranolol for tremor - referral with Dr. CAHORS at neurology-she placed him on Seroquel 25 mg - 12.5 mg before bed with significant improvement in sleep-  worse on nights before appointment though A/P: parkinsons stable- continue follow up with Dr. Arbutus Leas -some decreased mobility with this and wants to exercise more- I think PT referral would be advisable   #hypertension S: medication: amlodipine-benazepril 10-20 mg in the morning Home readings #s: not checking BP Readings from Last 3 Encounters:  01/13/22 (!) 122/58  01/04/22 (!) 151/67  08/02/21 123/63  A/P: Controlled. Continue current medications.   # history of CVA-presented with- left arm weakness and seen neurology before moving to [facility] in August 2021.  No residual weakness reported- perhaps some memory issues and trouble with swallowing #hyperlipidemia S: Medication:atorvastatin 40 mg daily with LDL typically under  70, aspirin 81 mg started after stroke Lab Results  Component Value Date   CHOL 134 11/27/2020   HDL 59 11/27/2020   LDLCALC 58 11/27/2020   TRIG 91 11/27/2020   CHOLHDL 2.3 11/27/2020  A/P: for lipids with stroke history want ldl below 70- update labs today- for now continue current meds  Some concern for aspiration- eats quickly and with large bites- some choking with swallowing.    # GERD S:Medication: omeprazole 20 mg - we discussed the option of trying Pepcid in the long-run and asked patient to consider this for 06/2021 visit - did not try yet  B12 levels related to PPI use: Have been low normal January 2022 Lab Results  Component Value Date   VITAMINB12 304 11/27/2020  A/P: update today b12 as low normal in past- did not start b12 Reflux still doing well with omeprazole- reasonable to try pepcid 20 mg twice dialy for a few days to see if controls symptoms and if not go back to omeprazole 20 mg   # Asthma S: Maintenance Medication: Advair 100-50 mg twice daily in past- not needing lately thankfully As needed medication: Albuterol. Patient is using this rarely per week- as needed A/P: doing well with albuterol very sparing use- continue current meds   # meloxicam for pain in neck in past-had some worsening renal function and came off and thankfully neck pain did not worsen in 2022- they confirmed today not taking   #keep an eye on mild anemia though thankfully fecal occult blood negative on 08/04/21 and cbc was stable.   Recommended follow up: No follow-ups on file. Future Appointments  Date Time Provider Department Center  07/14/2022  3:30 PM Tat, Octaviano Batty, DO [facility] None  11/22/2022 10:15 AM [facility] HEALTH COACH [facility]   Lab/Order associations:   ICD-10-CM   1. Parkinson disease (HCC)  G20 Ambulatory referral to Physical Therapy    2. Essential hypertension  I10     3. Hyperlipidemia, unspecified hyperlipidemia type  E78.5 CBC with Differential/Platelet     Comprehensive metabolic panel    Lipid panel    4. Gastroesophageal reflux disease without esophagitis  K21.9     5. High risk medication use  Z79.899 Vitamin B12    6. Choking, initial encounter  T17.308A Ambulatory referral to Speech Therapy    7. History of stroke  Z86.73 Ambulatory referral to Speech Therapy    8. Gait abnormality  R26.9 Ambulatory referral to Physical Therapy      No orders of the defined types were placed in this encounter.  I,Jada Bradford,acting as a scribe for Tana Conch, MD.,have documented all relevant documentation on the behalf of Tana Conch, MD,as directed by  Tana Conch, MD while in the presence of Tana Conch, MD.  I, Tana Conch, MD, have reviewed all documentation  for this visit. The documentation on 01/13/22 for the exam, diagnosis, procedures, and orders are all accurate and complete.   Return precautions advised.  Tana Conch, MD

## 2022-01-03 NOTE — Progress Notes (Signed)
Assessment/Plan:   1.  Parkinsons Disease, diagnosed 2018  -Continue carbidopa/levodopa 25/100, 2/1/1  -Continue carbidopa/levodopa 50/200 CR at bedtime  -wife has POA for healthcare and finances.  2.  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  Last ultrasound was in March, 2022.  We will monitor this approximately yearly.  -On aspirin, 81 mg daily.  -On Lipitor, 40 mg daily.  LDL was at goal.  Primary care monitoring.  3.  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  He is not driving.  -living at [facility] - independent with wife for now but looking into more step up care  -discussed level of care.  Wife states DNR now.    -Patient was not taking his quetiapine, because he thought it was causing him to see people.  Wife wanted me to explain to him the purpose of the medication, which I did.  However, I also told him and her that she could give it to him within ice cream and both agreed to that.  He will take quetiapine, 25 mg, half tablet in the daytime if needed, 1 tablet at nighttime.  Understands black box warning.    Subjective:   Darren [name] was seen today in follow up for Parkinsons disease.  My previous records were reviewed prior to todays visit as well as outside records available to me.  Patient worked in the visit today.  Patient accompanied by wife who supplements visit.  Patient had COVID last month.  Saw primary care via video visit.  Pt denies falls.  Pt denies lightheadedness, near syncope.  Wife concerned b/c patient having hallucinations but won't take his seroquel - he thinks that this medication is causing the people he sees.  She also wondered if it was making him sleepy, but he has refused the medication the last 2 weeks, and he has actually been more sleepy.  Current prescribed movement disorder medications: Carbidopa/levodopa 25/100, 2/1/1   Carbidopa/levodopa 50/200 CR at bedtime  Quetiapine, 25 mg, half tablet in the daytime if needed, 1 tablet at nighttime   PREVIOUS MEDICATIONS: propranolol  ALLERGIES:  No Known Allergies  CURRENT MEDICATIONS:  Outpatient Encounter Medications as of 01/04/2022  Medication Sig   albuterol (VENTOLIN HFA) 108 (90 Base) MCG/ACT inhaler INHALE 2 PUFFS INTO THE LUNGS EVERY 6 HOURS AS NEEDED FOR WHEEZING OR SHORTNESS OF BREATH   amLODipine-benazepril (LOTREL) 10-20 MG capsule TAKE 1 CAPSULE BY MOUTH EVERY DAY   ASPIRIN LOW DOSE 81 MG EC tablet TAKE 1 TABLET BY MOUTH EVERY DAY   atorvastatin (LIPITOR) 40 MG tablet TAKE 1 TABLET BY MOUTH EVERY DAY   carbidopa-levodopa (SINEMET CR) 50-200 MG tablet TAKE 1 TABLET BY MOUTH AT BEDTIME   carbidopa-levodopa (SINEMET IR) 25-100 MG tablet TAKE 2 TABLETS BY MOUTH EVERY MORNING AT 8AM and TAKE 1 TABLET BY MOUTH AT NOON and TAKE 1 TABLET BY MOUTH AT 4PM (Patient taking differently: TAKE 2 TABLETS AT 8AM and TAKE 1 TABLET  NOON and TAKE 1 TABLET  AT 4PM)   Cholecalciferol (VITAMIN D3 PO) Take 2 capsules by mouth daily.   D3 SUPER STRENGTH 50 MCG (2000 UT) CAPS TAKE 2 CAPSULES BY MOUTH EVERY DAY   omeprazole (PRILOSEC) 20 MG capsule TAKE 1 CAPSULE BY MOUTH EVERY DAY   fluticasone (FLONASE) 50 MCG/ACT nasal spray USE 2 SPRAYS in each nostril 2 TIMES DAILY (Patient not taking: Reported on 01/04/2022)  fluticasone-salmeterol (ADVAIR) 100-50 MCG/ACT AEPB Inhale 1 puff into the lungs 2 (two) times daily. (Patient not taking: Reported on 01/04/2022)   QUEtiapine (SEROQUEL) 25 MG tablet 1 at bedtime, 1/2 in day prn (Patient not taking: Reported on 01/04/2022)   No facility-administered encounter medications on file as of 01/04/2022.    Objective:   PHYSICAL EXAMINATION:    VITALS:   Vitals:   01/04/22 1441  BP: (!) 151/67  Pulse: 75  SpO2: 96%  Weight: 163 lb 6.4 oz (74.1 kg)  Height: 5\' 10"  (1.778 m)      GEN:  The patient appears stated age and is in  NAD. HEENT:  Normocephalic, atraumatic.  The mucous membranes are moist.   Neurological examination:  Orientation: The patient is alert and oriented to person Cranial nerves: There is good facial symmetry with facial hypomimia. The speech is fluent and clear. Soft palate rises symmetrically and there is no tongue deviation. Hearing is intact to conversational tone. Sensation: Sensation is intact to light touch throughout Motor: Strength is at least antigravity x4.  Movement examination: Tone: There is nl tone in the ue/le Abnormal movements: none today Coordination:  There is only apraxia on the L with RAMs and not much decremation Gait and Station: The patients stride length is decreased.  Ambulates with cane   I have reviewed and interpreted the following labs independently    Chemistry      Component Value Date/Time   NA 142 07/06/2021 1153   K 4.5 07/06/2021 1153   CL 107 07/06/2021 1153   CO2 28 07/06/2021 1153   BUN 27 (H) 07/06/2021 1153   CREATININE 1.23 07/06/2021 1153   CREATININE 1.59 (H) 11/27/2020 1514      Component Value Date/Time   CALCIUM 9.4 07/06/2021 1153   ALKPHOS 61 07/06/2021 1153   AST 18 07/06/2021 1153   ALT 3 07/06/2021 1153   BILITOT 0.6 07/06/2021 1153       Lab Results  Component Value Date   WBC 5.7 07/29/2021   HGB 12.5 (L) 07/29/2021   HCT 36.9 (L) 07/29/2021   MCV 95.4 07/29/2021   PLT 240.0 07/29/2021    No results found for: TSH  time spent with today's visit, both face-to-face and non-face-to-face, coordinating care: 32 minutes   Cc:  Marin Olp, MD

## 2022-01-04 ENCOUNTER — Encounter: Payer: Self-pay | Admitting: Neurology

## 2022-01-04 ENCOUNTER — Other Ambulatory Visit: Payer: Self-pay

## 2022-01-04 ENCOUNTER — Ambulatory Visit (INDEPENDENT_AMBULATORY_CARE_PROVIDER_SITE_OTHER): Payer: Medicare Other | Admitting: Neurology

## 2022-01-04 VITALS — BP 151/67 | HR 75 | Ht 70.0 in | Wt 163.4 lb

## 2022-01-04 DIAGNOSIS — R441 Visual hallucinations: Secondary | ICD-10-CM

## 2022-01-04 DIAGNOSIS — G20 Parkinson's disease: Secondary | ICD-10-CM | POA: Diagnosis not present

## 2022-01-04 DIAGNOSIS — F02818 Dementia in other diseases classified elsewhere, unspecified severity, with other behavioral disturbance: Secondary | ICD-10-CM

## 2022-01-04 NOTE — Patient Instructions (Signed)
Good to see you today!  As we discussed, you can take the seroquel at bed with ice cream and in the day as needed.  It will help to decrease the hallucinations.

## 2022-01-05 ENCOUNTER — Other Ambulatory Visit: Payer: Self-pay | Admitting: Neurology

## 2022-01-05 DIAGNOSIS — G20 Parkinson's disease: Secondary | ICD-10-CM

## 2022-01-13 ENCOUNTER — Other Ambulatory Visit: Payer: Self-pay

## 2022-01-13 ENCOUNTER — Encounter: Payer: Self-pay | Admitting: Family Medicine

## 2022-01-13 ENCOUNTER — Ambulatory Visit (INDEPENDENT_AMBULATORY_CARE_PROVIDER_SITE_OTHER): Payer: Medicare Other | Admitting: Family Medicine

## 2022-01-13 VITALS — BP 122/58 | HR 65 | Temp 98.0°F | Ht 70.0 in | Wt 163.6 lb

## 2022-01-13 DIAGNOSIS — K219 Gastro-esophageal reflux disease without esophagitis: Secondary | ICD-10-CM | POA: Diagnosis not present

## 2022-01-13 DIAGNOSIS — Z79899 Other long term (current) drug therapy: Secondary | ICD-10-CM

## 2022-01-13 DIAGNOSIS — I10 Essential (primary) hypertension: Secondary | ICD-10-CM | POA: Diagnosis not present

## 2022-01-13 DIAGNOSIS — G20A1 Parkinson's disease without dyskinesia, without mention of fluctuations: Secondary | ICD-10-CM

## 2022-01-13 DIAGNOSIS — R269 Unspecified abnormalities of gait and mobility: Secondary | ICD-10-CM

## 2022-01-13 DIAGNOSIS — T17308A Unspecified foreign body in larynx causing other injury, initial encounter: Secondary | ICD-10-CM

## 2022-01-13 DIAGNOSIS — Z8673 Personal history of transient ischemic attack (TIA), and cerebral infarction without residual deficits: Secondary | ICD-10-CM

## 2022-01-13 DIAGNOSIS — E785 Hyperlipidemia, unspecified: Secondary | ICD-10-CM

## 2022-01-13 DIAGNOSIS — G20 Parkinson's disease: Secondary | ICD-10-CM | POA: Diagnosis not present

## 2022-01-13 LAB — COMPREHENSIVE METABOLIC PANEL
ALT: 5 U/L (ref 0–53)
AST: 18 U/L (ref 0–37)
Albumin: 4.4 g/dL (ref 3.5–5.2)
Alkaline Phosphatase: 67 U/L (ref 39–117)
BUN: 24 mg/dL — ABNORMAL HIGH (ref 6–23)
CO2: 32 mEq/L (ref 19–32)
Calcium: 9.5 mg/dL (ref 8.4–10.5)
Chloride: 106 mEq/L (ref 96–112)
Creatinine, Ser: 1.27 mg/dL (ref 0.40–1.50)
GFR: 51.63 mL/min — ABNORMAL LOW (ref >60.00)
Glucose, Bld: 89 mg/dL (ref 70–99)
Potassium: 4.4 mEq/L (ref 3.5–5.1)
Sodium: 143 mEq/L (ref 135–145)
Total Bilirubin: 0.8 mg/dL (ref 0.2–1.2)
Total Protein: 6.5 g/dL (ref 6.0–8.3)

## 2022-01-13 LAB — CBC WITH DIFFERENTIAL/PLATELET
Basophils Absolute: 0 10*3/uL (ref 0.0–0.1)
Basophils Relative: 0.7 % (ref 0.0–3.0)
Eosinophils Absolute: 0.1 10*3/uL (ref 0.0–0.7)
Eosinophils Relative: 2.4 % (ref 0.0–5.0)
HCT: 37.3 % — ABNORMAL LOW (ref 39.0–52.0)
Hemoglobin: 12.6 g/dL — ABNORMAL LOW (ref 13.0–17.0)
Lymphocytes Relative: 15 % (ref 12.0–46.0)
Lymphs Abs: 0.9 10*3/uL (ref 0.7–4.0)
MCHC: 33.8 g/dL (ref 30.0–36.0)
MCV: 93.2 fl (ref 78.0–100.0)
Monocytes Absolute: 0.5 10*3/uL (ref 0.1–1.0)
Monocytes Relative: 8.7 % (ref 3.0–12.0)
Neutro Abs: 4.4 10*3/uL (ref 1.4–7.7)
Neutrophils Relative %: 73.2 % (ref 43.0–77.0)
Platelets: 211 10*3/uL (ref 150.0–400.0)
RBC: 4 Mil/uL — ABNORMAL LOW (ref 4.22–5.81)
RDW: 14.4 % (ref 11.5–15.5)
WBC: 5.9 10*3/uL (ref 4.0–10.5)

## 2022-01-13 LAB — VITAMIN B12: Vitamin B-12: 235 pg/mL (ref 211–911)

## 2022-01-13 LAB — LIPID PANEL
Cholesterol: 137 mg/dL (ref 0–200)
HDL: 56.3 mg/dL (ref >39.00)
LDL Cholesterol: 57 mg/dL (ref 0–99)
NonHDL: 80.35
Total CHOL/HDL Ratio: 2
Triglycerides: 118 mg/dL (ref 0.0–149.0)
VLDL: 23.6 mg/dL (ref 0.0–40.0)

## 2022-01-13 NOTE — Patient Instructions (Addendum)
Health Maintenance Due  Topic Date Due   Zoster Vaccines- Shingrix (2 of 2) 10/06/2021  - let us know when you get this  reasonable to try pepcid 20 mg twice dialy for a few days to see if controls symptoms and if not go back to omeprazole 20 mg   We will call you within two weeks about your referral to speech and physical therapy at [facility]. If you do not hear within 3 weeks, give [place] a call or could even just talk directly to them first.  -keba also please just print these off so they can drop them off and see if that is sufficient   Please stop by lab before you go If you have mychart- we will send your results within 3 business days of [place] receiving them.  If you do not have mychart- we will call you about results within 5 business days of [place] receiving them.  *please also note that you will see labs on mychart as soon as they post. I will later go in and write notes on them- will say "notes from Dr. Durene Cal"   Recommended follow up: Return in about 6 months (around 07/13/2022) for follow up- or sooner if needed.

## 2022-01-15 ENCOUNTER — Emergency Department (HOSPITAL_COMMUNITY)
Admission: EM | Admit: 2022-01-15 | Discharge: 2022-01-15 | Disposition: A | Discharge status: Home / Self Care | Payer: Medicare Other | Attending: Emergency Medicine | Admitting: Emergency Medicine

## 2022-01-15 ENCOUNTER — Emergency Department (HOSPITAL_COMMUNITY): Payer: Medicare Other

## 2022-01-15 ENCOUNTER — Other Ambulatory Visit: Payer: Self-pay

## 2022-01-15 ENCOUNTER — Encounter (HOSPITAL_COMMUNITY): Payer: Self-pay | Admitting: Emergency Medicine

## 2022-01-15 DIAGNOSIS — I10 Essential (primary) hypertension: Secondary | ICD-10-CM | POA: Insufficient documentation

## 2022-01-15 DIAGNOSIS — T148XXA Other injury of unspecified body region, initial encounter: Secondary | ICD-10-CM

## 2022-01-15 DIAGNOSIS — G20 Parkinson's disease: Secondary | ICD-10-CM | POA: Diagnosis not present

## 2022-01-15 DIAGNOSIS — Z79899 Other long term (current) drug therapy: Secondary | ICD-10-CM | POA: Diagnosis not present

## 2022-01-15 DIAGNOSIS — S0181XA Laceration without foreign body of other part of head, initial encounter: Secondary | ICD-10-CM | POA: Insufficient documentation

## 2022-01-15 DIAGNOSIS — W01198A Fall on same level from slipping, tripping and stumbling with subsequent striking against other object, initial encounter: Secondary | ICD-10-CM | POA: Insufficient documentation

## 2022-01-15 DIAGNOSIS — Y92009 Unspecified place in unspecified non-institutional (private) residence as the place of occurrence of the external cause: Secondary | ICD-10-CM | POA: Diagnosis not present

## 2022-01-15 DIAGNOSIS — Z7982 Long term (current) use of aspirin: Secondary | ICD-10-CM | POA: Diagnosis not present

## 2022-01-15 DIAGNOSIS — S0990XA Unspecified injury of head, initial encounter: Secondary | ICD-10-CM

## 2022-01-15 DIAGNOSIS — S51811A Laceration without foreign body of right forearm, initial encounter: Secondary | ICD-10-CM | POA: Diagnosis not present

## 2022-01-15 DIAGNOSIS — W19XXXA Unspecified fall, initial encounter: Secondary | ICD-10-CM

## 2022-01-15 NOTE — Discharge Instructions (Addendum)
Your CT imaging of the head did not show acute fracture or intracranial bleeding.  Your imaging of the arm did not show any acute fractures.  I suspect you have primarily soft tissue and skin tear injuries.  Please keep them clean and dressed and follow-up with your primary doctor.  Please watch for signs and symptoms of infection.  Your tetanus shot was up-to-date.  If any symptoms change or worsen, please return to the nearest emergency department.

## 2022-01-15 NOTE — ED Provider Notes (Signed)
[facility]-EMERGENCY DEPT Provider Note   CSN: [id number] Arrival date & time: 01/15/22  2017     History  Chief Complaint  Patient presents with   Fall   Arm Injury   Head Injury    Darren [name] is a 88 y.o. male.  The history is provided by the patient and medical records. No language interpreter was used.  Fall This is a new problem. The current episode started less than 1 hour ago. The problem occurs rarely. The problem has not changed since onset.Associated symptoms include headaches. Pertinent negatives include no chest pain, no abdominal pain and no shortness of breath. Nothing aggravates the symptoms. Nothing relieves the symptoms. He has tried nothing for the symptoms. The treatment provided no relief.  Arm Injury Associated symptoms: no back pain, no fatigue, no fever and no neck pain   Head Injury Associated symptoms: headache   Associated symptoms: no nausea, no neck pain, no numbness, no seizures and no vomiting       Home Medications Prior to Admission medications   Medication Sig Start Date End Date Taking? Authorizing Provider  albuterol (VENTOLIN HFA) 108 (90 Base) MCG/ACT inhaler INHALE 2 PUFFS INTO THE LUNGS EVERY 6 HOURS AS NEEDED FOR WHEEZING OR SHORTNESS OF BREATH 08/03/21   [name], MD  amLODipine-benazepril (LOTREL) 10-20 MG capsule TAKE 1 CAPSULE BY MOUTH EVERY DAY 10/27/21   [name], MD  ASPIRIN LOW DOSE 81 MG EC tablet TAKE 1 TABLET BY MOUTH EVERY DAY 12/10/21   [name], MD  atorvastatin (LIPITOR) 40 MG tablet TAKE 1 TABLET BY MOUTH EVERY DAY 05/31/21   [name], MD  carbidopa-levodopa (SINEMET CR) 50-200 MG tablet TAKE 1 TABLET BY MOUTH AT BEDTIME 01/05/22   Tat, Rebecca S, DO  carbidopa-levodopa (SINEMET IR) 25-100 MG tablet TAKE 2 TABLETS AT 8AM and TAKE 1 TABLET  NOON and TAKE 1 TABLET  AT 4PM 01/05/22   Tat, Octaviano Batty, DO  Cholecalciferol (VITAMIN D3 PO) Take 2 capsules by mouth daily.     [provider]  D3 SUPER STRENGTH 50 MCG (2000 UT) CAPS TAKE 2 CAPSULES BY MOUTH EVERY DAY 12/10/21   [name], MD  fluticasone [facility]) 50 MCG/ACT nasal spray USE 2 SPRAYS in each nostril 2 TIMES DAILY 07/16/21   [name], MD  fluticasone-salmeterol (ADVAIR) 100-50 MCG/ACT AEPB Inhale 1 puff into the lungs 2 (two) times daily. 07/06/21   [name], MD  omeprazole (PRILOSEC) 20 MG capsule TAKE 1 CAPSULE BY MOUTH EVERY DAY 05/31/21   [name], MD  QUEtiapine (SEROQUEL) 25 MG tablet 1 at bedtime, 1/2 in day prn 08/02/21   Tat, Octaviano Batty, DO      Allergies    Patient has no known allergies.    Review of Systems   Review of Systems  Constitutional:  Negative for chills, fatigue and fever.  HENT:  Negative for congestion.   Respiratory:  Negative for chest tightness, shortness of breath and wheezing.   Cardiovascular:  Negative for chest pain and palpitations.  Gastrointestinal:  Negative for abdominal pain, diarrhea, nausea and vomiting.  Genitourinary:  Negative for dysuria, flank pain and frequency.  Musculoskeletal:  Negative for back pain, neck pain and neck stiffness.  Skin:  Positive for wound.  Neurological:  Positive for headaches. Negative for seizures, weakness, light-headedness and numbness.  Psychiatric/Behavioral:  Negative for agitation and confusion.   All other systems reviewed and are  negative.  Physical Exam Updated Vital Signs There were no vitals taken for this visit. Physical Exam Vitals and nursing note reviewed.  Constitutional:      General: He is not in acute distress.    Appearance: He is well-developed. He is not ill-appearing, toxic-appearing or diaphoretic.  HENT:     Head:      Comments: No focal neurologic deficits.  Pupils symmetric and reactive normal extraocccular movements    Nose: No congestion or rhinorrhea.     Mouth/Throat:     Mouth: Mucous membranes are moist.     Pharynx: No oropharyngeal exudate or  posterior oropharyngeal erythema.  Eyes:     Extraocular Movements: Extraocular movements intact.     Conjunctiva/sclera: Conjunctivae normal.     Pupils: Pupils are equal, round, and reactive to light.  Cardiovascular:     Rate and Rhythm: Normal rate and regular rhythm.     Heart sounds: No murmur heard. Pulmonary:     Effort: Pulmonary effort is normal. No respiratory distress.     Breath sounds: Normal breath sounds.  Abdominal:     Palpations: Abdomen is soft.     Tenderness: There is no abdominal tenderness. There is no right CVA tenderness or left CVA tenderness.  Musculoskeletal:        General: Tenderness present. No swelling or deformity.     Cervical back: Neck supple. No tenderness.     Right lower leg: No edema.     Left lower leg: No edema.     Comments: 2 large skin tears to his right arm.  Large 1 in the posterior distal humerus area and 1 on the radial side of his distal forearm.  Hemostatic.  Intact sensation, strength, and pulses distally.  No laceration that would be amenable to sutures but more delicate skin tears.  Skin:    General: Skin is warm and dry.     Capillary Refill: Capillary refill takes less than 2 seconds.  Neurological:     General: No focal deficit present.     Mental Status: He is alert.     Sensory: No sensory deficit.     Motor: No weakness.  Psychiatric:        Mood and Affect: Mood normal.    ED Results / Procedures / Treatments   Labs (all labs ordered are listed, but only abnormal results are displayed) Labs Reviewed - No data to display  EKG None  Radiology DG Wrist Complete Right  Result Date: 01/15/2022 CLINICAL DATA:  Fall, right wrist injury EXAM: RIGHT WRIST - COMPLETE 3+ VIEW COMPARISON:  None. FINDINGS: Normal alignment. No acute fracture or dislocation. Mild degenerative arthritis noted at the base of the thumb involving the first [facility] and triscaphe joints. Nonspecific cyst within the pisiform. Remaining joint spaces are  preserved. There is soft tissue swelling dorsal to the carpus. IMPRESSION: Dorsal soft tissue swelling.  No fracture or dislocation. Electronically Signed   By: Helyn NumbersAshesh  Parikh M.D.   On: 01/15/2022 21:07   CT HEAD WO CONTRAST (5MM)  Result Date: 01/15/2022 CLINICAL DATA:  Fall, head strike EXAM: CT HEAD WITHOUT CONTRAST TECHNIQUE: Contiguous axial images were obtained from the base of the skull through the vertex without intravenous contrast. RADIATION DOSE REDUCTION: This exam was performed according to the departmental dose-optimization program which includes automated exposure control, adjustment of the mA and/or kV according to patient size and/or use of iterative reconstruction technique. COMPARISON:  None. FINDINGS: Brain: No evidence of acute  infarction, hemorrhage, cerebral edema, mass, mass effect, or midline shift. No hydrocephalus or extra-axial fluid collection. Encephalomalacia in the right frontal lobe, consistent with sequela of remote infarct. Periventricular white matter changes, likely the sequela of chronic small vessel ischemic disease. Vascular: No hyperdense vessel. Skull: Normal. Negative for fracture or focal lesion. Sinuses/Orbits: Sequela of prior right orbital and maxillary wall fractures. Mucosal thickening in the maxillary sinuses with osseous thickening consistent with chronic sinusitis. No other significant sinus opacification. Status post bilateral lens replacements. Other: The mastoid air cells are well aerated. IMPRESSION: IMPRESSION No acute intracranial process. Electronically Signed   By: Wiliam Ke M.D.   On: 01/15/2022 21:27   DG Humerus Right  Result Date: 01/15/2022 CLINICAL DATA:  Fall, right arm injury EXAM: RIGHT HUMERUS - 2+ VIEW COMPARISON:  None. FINDINGS: There is no evidence of fracture or other focal bone lesions. Soft tissues are unremarkable. IMPRESSION: Negative. Electronically Signed   By: Helyn Numbers M.D.   On: 01/15/2022 21:05     Procedures Procedures    Medications Ordered in ED Medications - No data to display  ED Course/ Medical Decision Making/ A&P                           Medical Decision Making Amount and/or Complexity of Data Reviewed Radiology: ordered.    TORANCE PEREIDA is a 88 y.o. male with a past medical history significant for Parkinson's disease, previous stroke, GERD, hypertension, and hyperlipidemia who presents with fall and injuries.  According to patient, he got his feet tripped up and went to the ground today but did not lose conscious.  He sustained injury to his head and right arm.  He is right-handed.  He reports mild headache but otherwise denies nausea, vomiting, speech, or vision changes.  denies any preceding symptoms such as chest pain, palpitations, shortness of breath, constipation, diarrhea, or urinary changes.  Patient also sustained injuries to his right arm with skin tears.  He is reporting pain in his right forearm where the skin tears are but otherwise has no complaints elsewhere.  On exam, lungs clear and chest nontender.  Abdomen nontender.  Patient has symmetric grip strength, sensation, and pulses in upper extremities.  Patient has a large skin tear to his right radial forearm that is hemostatic.  He also has a skin tear to his posterior upper arm near his tricep area.  Hemostatic.  Patient has abrasion to his right forehead where he fell and hit.  Pupils symmetric and reactive normal extract movements.  Neck nontender.  Clear speech.  Symmetric smile.  No evidence of entrapment or other facial injury.  Had a shared decision made conversation with patient and family.  Given his lack of preceding symptoms and visiting the PCP several days ago with reassuring exam, they agree to primarily focus on imaging to rule out traumatic injuries but not do further work-up otherwise.  We agreed to get CT head, and x-ray of his right humerus and right wrist.  X-ray showed no acute  fractures.  No skull fracture or bleeding internally seen in the head.  Tdap is up-to-date.  Patient will have his wounds dressed for the skin tears as they did not appear to be amenable to sutures.  Family agrees.  They will follow-up with PCP.  Patient otherwise or concerns and was discharged in good condition after wound repair and after reassuring work-up.  They understood return precautions for delayed bleed  and other complication/infection to the wounds.         Final Clinical Impression(s) / ED Diagnoses Final diagnoses:  Fall, initial encounter  Injury of head, initial encounter  Multiple skin tears    Rx / DC Orders ED Discharge Orders     None       Clinical Impression: 1. Fall, initial encounter   2. Injury of head, initial encounter   3. Multiple skin tears     Disposition: Discharge  Condition: Good  I have discussed the results, Dx and Tx plan with the pt(& family if present). He/she/they expressed understanding and agree(s) with the plan. Discharge instructions discussed at great length. Strict return precautions discussed and pt &/or family have verbalized understanding of the instructions. No further questions at time of discharge.    New Prescriptions   No medications on file    Follow Up: [name], MD [address] [place] [address] [phone]     [facility] COMMUNITY [facility] DEPT [address] [id number] mc [place] [address] [phone]        Elver Stadler, Canary Brim, MD 01/15/22 [phone]

## 2022-01-15 NOTE — ED Triage Notes (Signed)
Patient presents post fall today at his home. He reports hitting his head and right arm during the fall. Due to the fall, he reports having skin tears to the head and right arm. He is not on blood thinners.   EMS vitals: 152/72 BP 72 HR 96 % SPO2 on room air 127 CBG

## 2022-01-16 ENCOUNTER — Encounter: Payer: Self-pay | Admitting: Neurology

## 2022-01-16 ENCOUNTER — Encounter: Payer: Self-pay | Admitting: Family Medicine

## 2022-01-17 ENCOUNTER — Other Ambulatory Visit: Payer: Self-pay

## 2022-01-17 DIAGNOSIS — S51819A Laceration without foreign body of unspecified forearm, initial encounter: Secondary | ICD-10-CM

## 2022-01-18 ENCOUNTER — Encounter: Payer: Self-pay | Admitting: Physician Assistant

## 2022-01-18 ENCOUNTER — Telehealth (INDEPENDENT_AMBULATORY_CARE_PROVIDER_SITE_OTHER): Payer: Medicare Other | Admitting: Physician Assistant

## 2022-01-18 ENCOUNTER — Other Ambulatory Visit: Payer: Self-pay

## 2022-01-18 DIAGNOSIS — T148XXA Other injury of unspecified body region, initial encounter: Secondary | ICD-10-CM | POA: Diagnosis not present

## 2022-01-18 NOTE — Progress Notes (Signed)
° °  Virtual Visit via Video Note  I connected with  Darren [name]  on 01/18/22 at 12:00 PM EST by a video enabled telemedicine application and verified that I am speaking with the correct person using two identifiers.  Location: Patient: home Provider: Nature conservation officer at [employer] Persons present: Patient, patient's son, and myself   I discussed the limitations of evaluation and management by telemedicine and the availability of in person appointments. The patient expressed understanding and agreed to proceed.   History of Present Illness:   Patient presents for virtual visit today to discuss new skin tear of right arm.  This is status post trip and fall for which he was seen in the emergency department on 01/15/2022.  Family is present for the visit today and said that they are concerned about possible infection setting in and need instructions on how to properly care for the skin tear.  They also have an appointment with the wound care center on March 20. Nursing agency is coming out in two days for assessment as well.    Observations/Objective:   Gen: Awake, alert, no acute distress Resp: Breathing is even and non-labored Psych: calm/pleasant demeanor Neuro: Alert and Oriented x 3, + facial symmetry, speech is clear.  Skin: R arm is wrapped from wrist to shoulder in coban dressing   Assessment and Plan:   1. Multiple skin tears -Pt did not want to remove his dressing today due to fear of pain this would cause.  -Discussed with patient and son they should leave the baseline Mepitel in place, but proceed with unwrapping the coban and gauze, washing gently around the area with soap and water, pat dry, then re-wrap with dressing they were provided from in the ED. This would also allow them to assess for any underlying signs of infection including odor, oozing, increased redness, etc.  -Nurse to come out to house in 2 days to reassess and dress wound as well. -Advised  against antibiotics for prevention of infection due to UpToDate guidelines. -Pt understanding and agreeable    Follow Up Instructions:    I discussed the assessment and treatment plan with the patient. The patient was provided an opportunity to ask questions and all were answered. The patient agreed with the plan and demonstrated an understanding of the instructions.   The patient was advised to call back or seek an in-person evaluation if the symptoms worsen or if the condition fails to improve as anticipated.  Redmond Whittley M Harjit Leider, PA-C

## 2022-01-19 ENCOUNTER — Telehealth: Payer: Medicare Other | Admitting: Family Medicine

## 2022-01-24 ENCOUNTER — Encounter: Payer: Self-pay | Admitting: Family Medicine

## 2022-01-24 ENCOUNTER — Telehealth: Payer: Self-pay | Admitting: Family Medicine

## 2022-01-24 NOTE — Telephone Encounter (Signed)
Pt's wife stated pt's hand is red and swollen. She is asking to see Dr Durene Cal. She is worried about infection. They are unable to come in today due to transportation. Can I schedule them on 01/25/22 at 11:40am? They prefer Dr Durene Cal. Please Advise ?

## 2022-01-24 NOTE — Telephone Encounter (Signed)
See pictures below. 

## 2022-01-24 NOTE — Telephone Encounter (Signed)
Yes, that will be fine.

## 2022-01-25 ENCOUNTER — Ambulatory Visit (INDEPENDENT_AMBULATORY_CARE_PROVIDER_SITE_OTHER): Payer: Medicare Other | Admitting: Family Medicine

## 2022-01-25 ENCOUNTER — Encounter: Payer: Self-pay | Admitting: Family Medicine

## 2022-01-25 ENCOUNTER — Telehealth: Payer: Self-pay | Admitting: Family Medicine

## 2022-01-25 ENCOUNTER — Other Ambulatory Visit: Payer: Self-pay

## 2022-01-25 VITALS — BP 130/62 | HR 76 | Temp 98.3°F | Ht 70.0 in | Wt 163.0 lb

## 2022-01-25 DIAGNOSIS — T148XXA Other injury of unspecified body region, initial encounter: Secondary | ICD-10-CM

## 2022-01-25 DIAGNOSIS — L03113 Cellulitis of right upper limb: Secondary | ICD-10-CM | POA: Diagnosis not present

## 2022-01-25 MED ORDER — CEPHALEXIN 500 MG PO CAPS: 500.0000 mg | ORAL_CAPSULE | Freq: Three times a day (TID) | ORAL | 0 refills | Status: AC

## 2022-01-25 NOTE — Telephone Encounter (Signed)
Called and spoke with Judeth Cornfield and she wants to know when do you want pt bandage changed and how often do you want them to change it and what do you want them to dress it with? Collie Siad that I would call her back tomorrow regarding the above and made her aware pt had OV today. ?

## 2022-01-25 NOTE — Patient Instructions (Addendum)
Wound looks way better than the pictures you sent thankfully!  ? ?On the other hand - his hand is more red, swollen, warm concerning for some soft tissue infection/cellulitis - we opted to cover for bacterial infection with keflex.  ? ?I will send home health back out to reassess- if they cannot come by end of the week- I still think taking outer dressing off (under dressing can stay on 14 days) and just making sure everything is doing well and not worsening (no pus, not warmer than already is) is reasonable.  ? ?You may not need long term wound care but for now lets keep appointment on the 20th because it is so hard to get an appointment ? ?Recommended follow up: Return for as needed for new, worsening, persistent symptoms. ?

## 2022-01-25 NOTE — Progress Notes (Addendum)
?Phone [phone] ?In person visit ?  ?Subjective:  ? ?Darren [name] is a 88 y.o. year old very pleasant male patient who presents for/with See problem oriented charting ?Chief Complaint  ?Patient presents with  ? skin tear on arm  ? ? ?This visit occurred during the SARS-CoV-2 public health emergency.  Safety protocols were in place, including screening questions prior to the visit, additional usage of staff PPE, and extensive cleaning of exam room while observing appropriate contact time as indicated for disinfecting solutions.  ? ?Past Medical History-  ?Patient Active Problem List  ? Diagnosis Date Noted  ? Parkinson disease (HCC) 11/27/2020  ?  Priority: High  ? Essential hypertension 07/06/2021  ?  Priority: Medium   ? Hyperlipidemia 11/27/2020  ?  Priority: Medium   ? Gastroesophageal reflux disease without esophagitis 11/27/2020  ?  Priority: Medium   ? History of CVA (cerebrovascular accident) 11/27/2020  ?  Priority: Low  ? ? ?Medications- reviewed and updated ?Current Outpatient Medications  ?Medication Sig Dispense Refill  ? albuterol (VENTOLIN HFA) 108 (90 Base) MCG/ACT inhaler INHALE 2 PUFFS INTO THE LUNGS EVERY 6 HOURS AS NEEDED FOR WHEEZING OR SHORTNESS OF BREATH 8.5 g 2  ? amLODipine-benazepril (LOTREL) 10-20 MG capsule TAKE 1 CAPSULE BY MOUTH EVERY DAY 90 capsule 1  ? ASPIRIN LOW DOSE 81 MG EC tablet TAKE 1 TABLET BY MOUTH EVERY DAY 90 tablet 3  ? atorvastatin (LIPITOR) 40 MG tablet TAKE 1 TABLET BY MOUTH EVERY DAY 90 tablet 4  ? carbidopa-levodopa (SINEMET CR) 50-200 MG tablet TAKE 1 TABLET BY MOUTH AT BEDTIME 90 tablet 1  ? carbidopa-levodopa (SINEMET IR) 25-100 MG tablet TAKE 2 TABLETS AT 8AM and TAKE 1 TABLET  NOON and TAKE 1 TABLET  AT 4PM 360 tablet 1  ? cephALEXin (KEFLEX) 500 MG capsule Take 1 capsule (500 mg total) by mouth 3 (three) times daily for 7 days. 21 capsule 0  ? Cholecalciferol (VITAMIN D3 PO) Take 2 capsules by mouth daily.    ? D3 SUPER STRENGTH 50 MCG (2000 UT) CAPS TAKE  2 CAPSULES BY MOUTH EVERY DAY 180 capsule 3  ? fluticasone (FLONASE) 50 MCG/ACT nasal spray USE 2 SPRAYS in each nostril 2 TIMES DAILY 16 g 0  ? fluticasone-salmeterol (ADVAIR) 100-50 MCG/ACT AEPB Inhale 1 puff into the lungs 2 (two) times daily. 1 each 11  ? omeprazole (PRILOSEC) 20 MG capsule TAKE 1 CAPSULE BY MOUTH EVERY DAY 90 capsule 3  ? QUEtiapine (SEROQUEL) 25 MG tablet 1 at bedtime, 1/2 in day prn 135 tablet 1  ? ?No current facility-administered medications for this visit.  ? ?  ?Objective:  ?BP 130/62   Pulse 76   Temp 98.3 ?F (36.8 ?C)   Ht 5\' 10"  (1.778 m)   Wt 163 lb (73.9 kg)   SpO2 96%   BMI 23.39 kg/m?  ?Gen: NAD, resting comfortably ?CV: RRR no murmurs rubs or gallops ?Lungs: CTAB no crackles, wheeze, rhonchi ?Abdomen: soft/nontender/nondistended/normal bowel sounds. No rebound or guarding.  ?Ext: no edema ?Skin: Multiple skin tears on right arm-longest of which is approximately the length of Mepitel dressing on the right distal forearm on radial side of wrist-not approximated by over a centimeter in several areas.  Surrounding this area there is warmth and erythema and tenderness-greatest portion of this noted in the proximal hand and extending some up proximal to the wound itself.  Open wound on ulnar side of wrist perhaps 3 x 4 cm.  Near the  elbow there is a well-healed wound on the radial side but on the back portion of the lower portion of the upper arm-there is another at least 4 x 4 centimeter wound. ?Neuro: grossly normal, moves all extremities ? ?Patient initially had Coban dressing covering ABD pads overlying Mepitel x5.  When we redressed the wound we applied Mepitel over ulnar side of wrist wound and radial side of wrist wound then covered with nonstick pad (based on level of exudate did not think needed ABD pad again) and then covered with Coban gently-we tried to reduce pressure as compared to prior dressing.  For the well-healed wound on the proximal forearm placed bacitracin  and then covered with nonstick dressing-covered wound on lower portion of upper arm with Mepitel.  We wrapped both of these areas with Kerlix and then Coban.  Only 3 total Mepitel were required due to improved healing ? ?  ? ?Assessment and Plan  ? ?#Multiple skin tears on right arm ?S: Patient was seen on January 18, 2022 by video visit by our team with multiple skin tears on his right arm-occurred after a trip and fall-had been seen in the emergency room on 01/15/2022 (also hit head and had reassuring head CT-no worsening headaches since that time-also had a right humerus x-ray which was read as no fractures).  Concern at the visit on the 28th was for potential for infection but there were not actively signs of infection at that time.  Unfortunately since that time patient has had redness, warmth, swelling develop in his right hand distal to the site of injuries starting last Thursday and has progressed since that time.  He also has some right wrist pain but had a wrist x-ray done on 01/15/2022 and no fracture was noted (we did discuss today potential for x-ray not showing fracture but still having underlying fracture and offered sports medicine visit-he would like to hold off for now and reports pain only really mild over the wrist-pain primarily associated with the skin disruption itself) ?- Has a wound care appointment but not until March 20th.  Nursing is coming out [facility].  This was only to be a skin assessment but they did ultimately help change dressing-patient currently has a base of Mepitel which is covered by ABD pads and then Coban dressing-swelling in his hand did worsen after this was applied ?A/P: Extensive wounds/skin tears-seem to be healing and we were able to reduce total amount of Mepitel used from 5-3 before covering dressing.  Patient will be managed by home health-orders were placed today-Mepitel can remain in place for 14 days to get into the wound care visit but  I would like overlying dressing changed at least every 2 to 3 days. ? ?Also appeared to have a local cellulitis around the distal ulnar wound-will cover with Keflex as per orders below  ?-Extensive counseling and time spent Managing wound was spent today ? ?Recommended follow up: Return for as needed for new, worsening, persistent symptoms. ?Future Appointments  ?Date Time Provider Department Center  ?02/07/2022  1:15 PM Camelia Phenes, DO [facility]  ?07/14/2022  3:30 PM Tat, Octaviano Batty, DO [facility] None  ?11/22/2022 10:15 AM [facility]  ? ? ?Lab/Order associations: ?  ICD-10-CM   ?1. Multiple skin tears  T14.8XXA Ambulatory referral to Home Health  ?  ?2. Cellulitis of right upper extremity  L03.113 Ambulatory referral to Home Health  ?  ? ? ?Meds ordered this  encounter  ?Medications  ? cephALEXin (KEFLEX) 500 MG capsule  ?  Sig: Take 1 capsule (500 mg total) by mouth 3 (three) times daily for 7 days.  ?  Dispense:  21 capsule  ?  Refill:  0  ? ?Time Spent: ?64 minutes of total time (12:00 PM- 1:04 PM- epic timed out while I was talking with patient/addressing wound) was spent on the date of the encounter performing the following actions: chart review prior to seeing the patient, obtaining history, performing a medically necessary exam, counseling on the treatment plan, placing orders, and documenting in our EHR.  ? ? ?Return precautions advised.  ?Tana Conch, MD ? ?

## 2022-01-25 NOTE — Telephone Encounter (Signed)
Needs a dressing with 2 Mepitel applied to the lower portion of forearm and up towards the wrist. -start with Mepitel then cover with nonstick dressing then kirlex (currently does not have Kirlex but I think with next application it should) then Coban ? ?Needs a separate dressing behind lower portion of upper arm (also has a closed wound and can apply bacitracin-start with Mepitel then cover with nonstick dressing then Kirlix then Coban ? ?There will be a portion of his forearm open with this method and less likely to pull. ? ?

## 2022-01-25 NOTE — Telephone Encounter (Signed)
[facility] [address]  [phone] called for authorization to continue treating patient. ?

## 2022-01-27 NOTE — Telephone Encounter (Signed)
Called and spoke with [place] and below message given.  ?

## 2022-01-28 ENCOUNTER — Telehealth: Payer: Self-pay

## 2022-01-28 NOTE — Telephone Encounter (Signed)
Noted  

## 2022-01-28 NOTE — Telephone Encounter (Signed)
Home Health Certification or Plan of Care Tracking ? ?Is this a Certification or Plan of Care? ? ?[place] [facility]: [facility]  ? ?Order Number:  [id number] ? ?Has charge sheet been attached? ? ?Where has form been placed:  [employer]  ? ?Faxed to:   [phone] ?

## 2022-02-03 ENCOUNTER — Ambulatory Visit: Payer: Medicare Other | Admitting: Neurology

## 2022-02-07 ENCOUNTER — Other Ambulatory Visit: Payer: Self-pay

## 2022-02-07 ENCOUNTER — Encounter (HOSPITAL_BASED_OUTPATIENT_CLINIC_OR_DEPARTMENT_OTHER): Payer: Medicare Other | Attending: Internal Medicine | Admitting: Internal Medicine

## 2022-02-07 DIAGNOSIS — L98492 Non-pressure chronic ulcer of skin of other sites with fat layer exposed: Secondary | ICD-10-CM | POA: Diagnosis present

## 2022-02-07 DIAGNOSIS — Z8673 Personal history of transient ischemic attack (TIA), and cerebral infarction without residual deficits: Secondary | ICD-10-CM | POA: Diagnosis not present

## 2022-02-07 DIAGNOSIS — S41101A Unspecified open wound of right upper arm, initial encounter: Secondary | ICD-10-CM

## 2022-02-07 DIAGNOSIS — I10 Essential (primary) hypertension: Secondary | ICD-10-CM | POA: Diagnosis not present

## 2022-02-07 DIAGNOSIS — G20 Parkinson's disease: Secondary | ICD-10-CM | POA: Diagnosis not present

## 2022-02-07 NOTE — Progress Notes (Addendum)
FELICE, DEEM ([id number]) ?Visit Report for 02/07/2022 ?Allergy List Details ?Patient Name: Date of Service: ?Darren [name], Darren [name] 02/07/2022 1:15 PM ?Medical Record Number: [id number] ?Patient Account Number: [id number] ?Date of Birth/Sex: Treating RN: ?11/25/1933 (88 y.o. Darren [name] ?Primary Care Damaria Vachon: Tana Conch Other Clinician: ?Referring Meranda Dechaine: ?Treating Avaline Stillson/Extender: Geralyn Corwin ?Tana Conch ?Weeks in Treatment: 0 ?Allergies ?Active Allergies ?No Known Allergies ?Allergy Notes ?Electronic Signature(s) ?Signed: 02/07/2022 4:26:43 PM By: Antonieta Iba ?Entered By: Antonieta Iba on 02/07/2022 13:12:25 ?-------------------------------------------------------------------------------- ?Arrival Information Details ?Patient Name: Date of Service: ?Darren [name], Darren [name] 02/07/2022 1:15 PM ?Medical Record Number: [id number] ?Patient Account Number: [id number] ?Date of Birth/Sex: Treating RN: ?15-Oct-1934 (88 y.o. Darren [name] ?Primary Care Novak Stgermaine: Tana Conch Other Clinician: ?Referring Ruger Saxer: ?Treating Mallika Sanmiguel/Extender: Geralyn Corwin ?Tana Conch ?Weeks in Treatment: 0 ?Visit Information ?Patient Arrived: [name] ?Arrival Time: 13:14 ?Accompanied By: Wife,Daughter in Law ?Transfer Assistance: None ?Patient Identification Verified: Yes ?Secondary Verification Process Completed: Yes ?Patient Requires Transmission-Based Precautions: No ?Patient Has Alerts: Yes ?Patient Alerts: Patient on Blood Thinner ?Electronic Signature(s) ?Signed: 02/07/2022 4:26:43 PM By: Antonieta Iba ?Entered By: Antonieta Iba on 02/07/2022 13:15:20 ?-------------------------------------------------------------------------------- ?Clinic Level of Care Assessment Details ?Patient Name: Date of Service: ?Darren [name], Darren [name] 02/07/2022 1:15 PM ?Medical Record Number: [id number] ?Patient Account Number: [id number] ?Date of Birth/Sex: Treating RN: ?October 20, 1934 (88 y.o. Darren [name] ?Primary Care Mattheu Brodersen: Tana Conch Other Clinician: ?Referring Shivaay Stormont: ?Treating Veta Dambrosia/Extender: Geralyn Corwin ?Tana Conch ?Weeks in Treatment: 0 ?Clinic Level of Care Assessment Items ?TOOL 1 Quantity Score ?X- 1 0 ?Use when EandM and Procedure is performed on INITIAL visit ?ASSESSMENTS - Nursing Assessment / Reassessment ?X- 1 20 ?General Physical Exam (combine w/ comprehensive assessment (listed just below) when performed on new pt. evals) ?X- 1 25 ?Comprehensive Assessment (HX, ROS, Risk Assessments, Wounds Hx, etc.) ?ASSESSMENTS - Wound and Skin Assessment / Reassessment ?[]  - 0 ?Dermatologic / Skin Assessment (not related to wound area) ?ASSESSMENTS - Ostomy and/or Continence Assessment and Care ?[]  - 0 ?Incontinence Assessment and Management ?[]  - 0 ?Ostomy Care Assessment and Management (repouching, etc.) ?PROCESS - Coordination of Care ?[]  - 0 ?Simple Patient / Family Education for ongoing care ?X- 1 20 ?Complex (extensive) Patient / Family Education for ongoing care ?X- 1 10 ?Staff obtains Consents, Records, T Results / Process Orders ?est ?X- 1 10 ?Staff telephones HHA, Nursing Homes / Clarify orders / etc ?[]  - 0 ?Routine Transfer to another Facility (non-emergent condition) ?[]  - 0 ?Routine Hospital Admission (non-emergent condition) ?[]  - 0 ?New Admissions / / Ordering NPWT Apligraf, etc. ?, ?[]  - 0 ?Emergency Hospital Admission (emergent condition) ?PROCESS - Special Needs ?[]  - 0 ?Pediatric / Minor Patient Management ?[]  - 0 ?Isolation Patient Management ?[]  - 0 ?Hearing / Language / Visual special needs ?[]  - 0 ?Assessment of Community assistance (transportation, D/C planning, etc.) ?[]  - 0 ?Additional assistance / Altered mentation ?[]  - 0 ?Support Surface(s) Assessment (bed, cushion, seat, etc.) ?INTERVENTIONS - Miscellaneous ?[]  - 0 ?External ear exam ?[]  - 0 ?Patient Transfer (multiple staff / / Similar devices) ?[]  - 0 ?Simple Staple / Suture removal (25 or less) ?[]  -  0 ?Complex Staple / Suture removal (26 or more) ?[]  - 0 ?Hypo/Hyperglycemic Management (do not check if billed separately) ?[]  - 0 ?Ankle / Brachial Index (ABI) - do not check if billed separately ?Has the patient been seen at the hospital within the last three years: Yes ?Total Score: 85 ?Level Of Care:  New/Established - Level 3 ?Electronic Signature(s) ?Signed: 02/09/2022 5:15:11 PM By: Antonieta Iba ?Entered By: Antonieta Iba on 02/09/2022 16:25:24 ?-------------------------------------------------------------------------------- ?Encounter Discharge Information Details ?Patient Name: ?Date of Service: ?Darren [name], Darren [name] 02/07/2022 1:15 PM ?Medical Record Number: [id number] ?Patient Account Number: [id number] ?Date of Birth/Sex: ?Treating RN: ?[birth year] (88 y.o. Darren [name] ?Primary Care Christain Mcraney: Tana Conch ?Other Clinician: ?Referring Haroldine Redler: ?Treating Lenzy Kerschner/Extender: Geralyn Corwin ?Tana Conch ?Weeks in Treatment: 0 ?Encounter Discharge Information Items ?Discharge Condition: Stable ?Ambulatory Status: [name] ?Discharge Destination: Home ?Transportation: Private Auto ?Accompanied By: Wife, Daughter in Law ?Schedule Follow-up Appointment: Yes ?Clinical Summary of Care: Provided on 02/07/2022 ?Form Type Recipient ?Paper Patient Patient ?Electronic Signature(s) ?Signed: 02/07/2022 3:47:27 PM By: Antonieta Iba ?Entered By: Antonieta Iba on 02/07/2022 15:47:27 ?-------------------------------------------------------------------------------- ?Lower Extremity Assessment Details ?Patient Name: ?Date of Service: ?Darren [name], Darren [name] 02/07/2022 1:15 PM ?Medical Record Number: [id number] ?Patient Account Number: [id number] ?Date of Birth/Sex: ?Treating RN: ?May 27, 1934 (88 y.o. Darren [name] ?Primary Care Waylon Hershey: Tana Conch ?Other Clinician: ?Referring Armina Galloway: ?Treating Emrick Hensch/Extender: Geralyn Corwin ?Tana Conch ?Weeks in Treatment: 0 ?Electronic Signature(s) ?Signed: 02/07/2022 1:52:35  PM By: Antonieta Iba ?Entered By: Antonieta Iba on 02/07/2022 13:52:35 ?-------------------------------------------------------------------------------- ?Multi Wound Chart Details ?Patient Name: ?Date of Service: ?Darren [name], Darren [name] 02/07/2022 1:15 PM ?Medical Record Number: [id number] ?Patient Account Number: [id number] ?Date of Birth/Sex: ?Treating RN: ?August 01, 1934 (88 y.o. M) ?Primary Care Odena Mcquaid: Tana Conch ?Other Clinician: ?Referring Alexsandro Salek: ?Treating Crissy Mccreadie/Extender: Geralyn Corwin ?Tana Conch ?Weeks in Treatment: 0 ?Vital Signs ?Height(in): 70 ?Pulse(bpm): 71 ?Weight(lbs): 160 ?Blood Pressure(mmHg): 154/68 ?Body Mass Index(BMI): 23 ?Temperature(??F): 97.6 ?Respiratory Rate(breaths/min): 18 ?Photos: ?Right, Medial Forearm Right Upper Arm Right Wrist ?Wound Location: ?Trauma Trauma Trauma ?Wounding Event: ?Trauma, Other Trauma, Other Trauma, Other ?Primary Etiology: ?Cataracts, Asthma, Hypertension, Cataracts, Asthma, Hypertension, Cataracts, Asthma, Hypertension, ?Comorbid History: ?Osteoarthritis Osteoarthritis Osteoarthritis ?01/15/2022 01/15/2022 01/15/2022 ?Date Acquired: ?0 0 0 ?Weeks of Treatment: ?Open Open Open ?Wound Status: ?No No No ?Wound Recurrence: ?12x2.5x0.1 2.7x6.2x0.1 2x2x0.1 ?Measurements L x W x D (cm) ?23.562 13.148 3.142 ?A (cm?) : ?rea ?2.356 1.315 0.314 ?Volume (cm?) : ?0.00% 0.00% 0.00% ?% Reduction in Area: ?0.00% 0.00% 0.00% ?% Reduction in Volume: ?Full Thickness Without Exposed Full Thickness Without Exposed Full Thickness Without Exposed ?Classification: ?Support Structures Support Structures Support Structures ?Medium Medium Medium ?Exudate Amount: ?Sanguinous Sanguinous Serosanguineous ?Exudate Type: ?red red red, brown ?Exudate Color: ?Distinct, outline attached Distinct, outline attached Distinct, outline attached ?Wound Margin: ?Large (67-100%) Large (67-100%) Large (67-100%) ?Granulation Amount: ?Red Red Red, Hyper-granulation ?Granulation Quality: ?None Present  (0%) None Present (0%) None Present (0%) ?Necrotic Amount: ?Fat Layer (Subcutaneous Tissue): Yes Fat Layer (Subcutaneous Tissue): Yes Fat Layer (Subcutaneous Tissue): Yes ?Exposed Structures: ?Fascia:

## 2022-02-07 NOTE — Progress Notes (Addendum)
Darren [name] ([id number]) ?Visit Report for 02/07/2022 ?Chief Complaint Document Details ?Patient Name: Date of Service: ?Darren [name], Darren [name] 02/07/2022 1:15 PM ?Medical Record Number: [id number] ?Patient Account Number: [id number] ?Date of Birth/Sex: Treating RN: ?1934/07/01 (88 y.o. M) ?Primary Care Provider: Tana Conch Other Clinician: ?Referring Provider: ?Treating Provider/Extender: Geralyn Corwin ?Tana Conch ?Weeks in Treatment: 0 ?Information Obtained from: Patient ?Chief Complaint ?02/07/2022; right upper extremity wounds following trauma ?Electronic Signature(s) ?Signed: 02/07/2022 2:48:21 PM By: Geralyn Corwin DO ?Entered By: Geralyn Corwin on 02/07/2022 14:40:31 ?-------------------------------------------------------------------------------- ?HPI Details ?Patient Name: Date of Service: ?Darren [name] 02/07/2022 1:15 PM ?Medical Record Number: [id number] ?Patient Account Number: [id number] ?Date of Birth/Sex: Treating RN: ?[birth year] (88 y.o. M) ?Primary Care Provider: Tana Conch Other Clinician: ?Referring Provider: ?Treating Provider/Extender: Geralyn Corwin ?Tana Conch ?Weeks in Treatment: 0 ?History of Present Illness ?HPI Description: Admission 02/07/2022 ?Mr. Faruq Rosenberger is an 88 year old male with a past medical history of CVA, essential hypertension and Parkinson's disease that presents to the clinic for a 1 ?month history of wounds to his right upper extremity. On 01/15/2022 he visited the ED because he had a fall and hit his head and had multiple skin tears to his ?right arm. He had x-rays of the right humerus and right wrist without any acute fractures. He had a CT of the head with no intercranial abnormality. He has ?been followed by his family practice for this issue. He was last seen on 01/25/2022 and prescribed Keflex for potential cellulitis to the wrist wound. He has been ?using Mepitel and changing the outer dressing every couple days. He currently denies signs of  infection. ?Electronic Signature(s) ?Signed: 02/07/2022 2:48:21 PM By: Geralyn Corwin DO ?Entered By: Geralyn Corwin on 02/07/2022 14:41:30 ?-------------------------------------------------------------------------------- ?Chemical Cauterization Details ?Patient Name: Date of Service: ?Darren [name] 02/07/2022 1:15 PM ?Medical Record Number: [id number] ?Patient Account Number: [id number] ?Date of Birth/Sex: Treating RN: ?08/02/1934 (88 y.o. [name] ?Primary Care Provider: Tana Conch Other Clinician: ?Referring Provider: ?Treating Provider/Extender: Geralyn Corwin ?Tana Conch ?Weeks in Treatment: 0 ?Procedure Performed for: Wound #3 Right Wrist ?Performed By: Physician Geralyn Corwin, DO ?Post Procedure Diagnosis ?Same as Pre-procedure ?Electronic Signature(s) ?Signed: 02/07/2022 2:48:21 PM By: Geralyn Corwin DO ?Signed: 02/07/2022 4:26:43 PM By: Antonieta Iba ?Entered By: Antonieta Iba on 02/07/2022 14:11:11 ?-------------------------------------------------------------------------------- ?Physical Exam Details ?Patient Name: Date of Service: ?Darren [name] 02/07/2022 1:15 PM ?Medical Record Number: [id number] ?Patient Account Number: [id number] ?Date of Birth/Sex: Treating RN: ?29-Mar-1934 (88 y.o. M) ?Primary Care Provider: Tana Conch Other Clinician: ?Referring Provider: ?Treating Provider/Extender: Geralyn Corwin ?Tana Conch ?Weeks in Treatment: 0 ?Constitutional ?respirations regular, non-labored and within target range for patient.Marland Kitchen ?Psychiatric ?pleasant and cooperative. ?Notes ?Wound examright upper extremity: There are 3 open wounds with granulation tissue throughout. These are located to the right medial forearm right upper arm ?and right wrist. The wrist wound is hyper granulated. No signs of infection. ?Electronic Signature(s) ?Signed: 02/07/2022 2:48:21 PM By: Geralyn Corwin DO ?Entered By: Geralyn Corwin on 02/07/2022  14:43:43 ?-------------------------------------------------------------------------------- ?Physician Orders Details ?Patient Name: Date of Service: ?Darren [name] 02/07/2022 1:15 PM ?Medical Record Number: [id number] ?Patient Account Number: [id number] ?Date of Birth/Sex: Treating RN: ?1934-01-31 (88 y.o. [name] ?Primary Care Provider: Tana Conch Other Clinician: ?Referring Provider: ?Treating Provider/Extender: Geralyn Corwin ?Tana Conch ?Weeks in Treatment: 0 ?Verbal / Phone Orders: No ?Diagnosis Coding ?ICD-10 Coding ?Code Description ?L98.492 Non-pressure chronic ulcer of skin of other sites with fat layer exposed ?S41.101A Unspecified open wound of  right upper arm, initial encounter ?G20 Parkinson's disease ?O96.29 Personal history of transient ischemic attack (TIA), and cerebral infarction without residual deficits ?I10 Essential (primary) hypertension ?Follow-up Appointments ?ppointment in 1 week. - with Dr. Mikey Bussing [facility] Laity, Room 7) ?Return A ?Bathing/ Shower/ Hygiene ?May shower and wash wound with soap and water. - when changing dressing ?Home Health ?New wound care orders this week; continue Home Health for wound care. May utilize formulary equivalent dressing for wound treatment ?orders unless otherwise specified. - Skilled nursing for wound care 2-3x week. ?Other Home Health Orders/Instructions: - [facility] ?Wound Treatment ?Wound #1 - Forearm Wound Laterality: Right, Medial ?Cleanser: Soap and Water [facility]) Every Other Day/30 Days ?Discharge Instructions: May shower and wash wound with dial antibacterial soap and water prior to dressing change. ?Cleanser: Wound Cleanser [facility]) Every Other Day/30 Days ?Discharge Instructions: Cleanse the wound with wound cleanser prior to applying a clean dressing using gauze sponges, not tissue or cotton balls. ?Prim Dressing: Xeroform Occlusive Gauze Dressing, 4x4 in [facility]) Every Other Day/30 Days ?ary ?Discharge  Instructions: Apply to wound bed as instructed ?Secondary Dressing: Woven Gauze Sponge, Non-Sterile 4x4 in [facility]) Every Other Day/30 Days ?Discharge Instructions: Apply over primary dressing as directed. ?Secured With: [employer], 4.5x3.1 (in/yd) [facility]) Every Other Day/30 Days ?Discharge Instructions: Secure with Kerlix as directed. ?Secured With: 80M Medipore H Soft Cloth Surgical T ape, 4 x 10 (in/yd) (Home Health) Every Other Day/30 Days ?Discharge Instructions: Secure with tape as directed. ?Add-Ons: Stetch Netting Every Other Day/30 Days ?Wound #2 - Upper Arm Wound Laterality: Right ?Cleanser: Soap and Water [facility]) Every Other Day/30 Days ?Discharge Instructions: May shower and wash wound with dial antibacterial soap and water prior to dressing change. ?Cleanser: Wound Cleanser [facility]) Every Other Day/30 Days ?Discharge Instructions: Cleanse the wound with wound cleanser prior to applying a clean dressing using gauze sponges, not tissue or cotton balls. ?Prim Dressing: Xeroform Occlusive Gauze Dressing, 4x4 in [facility]) Every Other Day/30 Days ?ary ?Discharge Instructions: Apply to wound bed as instructed ?Secondary Dressing: Woven Gauze Sponge, Non-Sterile 4x4 in [facility]) Every Other Day/30 Days ?Discharge Instructions: Apply over primary dressing as directed. ?Secured With: [employer], 4.5x3.1 (in/yd) [facility]) Every Other Day/30 Days ?Discharge Instructions: Secure with Kerlix as directed. ?Secured With: 80M Medipore H Soft Cloth Surgical T ape, 4 x 10 (in/yd) (Home Health) Every Other Day/30 Days ?Discharge Instructions: Secure with tape as directed. ?Add-Ons: Orthoptist [facility]) Every Other Day/30 Days ?Wound #3 - Wrist Wound Laterality: Right ?Cleanser: Soap and Water [facility]) Every Other Day/30 Days ?Discharge Instructions: May shower and wash wound with dial antibacterial soap and water prior to dressing change. ?Cleanser: Wound  Cleanser [facility]) Every Other Day/30 Days ?Discharge Instructions: Cleanse the wound with wound cleanser prior to applying a clean dressing using gauze sponges, not tissue or cotton balls. ?Prim Dressing: Hydrofera Blue Ready Foam, 2.5 x2.5 in (Home H

## 2022-02-07 NOTE — Progress Notes (Signed)
Darren [name], Darren [name] ([id number]) ?Visit Report for 02/07/2022 ?Abuse Risk Screen Details ?Patient Name: Date of Service: ?Darren [name], Darren [name] 02/07/2022 1:15 PM ?Medical Record Number: [id number] ?Patient Account Number: [id number] ?Date of Birth/Sex: Treating RN: ?10-17-1934 (88 y.o. [name] ?Primary Care Terriana Barreras: Tana Conch Other Clinician: ?Referring Corran Lalone: ?Treating Keanthony Poole/Extender: Geralyn Corwin ?Tana Conch ?Weeks in Treatment: 0 ?Abuse Risk Screen Items ?Answer ?ABUSE RISK SCREEN: ?Has anyone close to you tried to hurt or harm you recentlyo No ?Do you feel uncomfortable with anyone in your familyo No ?Has anyone forced you do things that you didnt want to doo No ?Electronic Signature(s) ?Signed: 02/07/2022 4:26:43 PM By: Antonieta Iba ?Entered By: Antonieta Iba on 02/07/2022 13:22:53 ?-------------------------------------------------------------------------------- ?Activities of Daily Living Details ?Patient Name: Date of Service: ?Darren [name], Darren [name] 02/07/2022 1:15 PM ?Medical Record Number: [id number] ?Patient Account Number: [id number] ?Date of Birth/Sex: Treating RN: ?Apr 12, 1934 (88 y.o. [name] ?Primary Care Marjean Imperato: Tana Conch Other Clinician: ?Referring Rochella Benner: ?Treating Keelin Sheridan/Extender: Geralyn Corwin ?Tana Conch ?Weeks in Treatment: 0 ?Activities of Daily Living Items ?Answer ?Activities of Daily Living (Please select one for each item) ?Drive Automobile Not Able ?T Medications ?ake Need Assistance ?Use T elephone Need Assistance ?Care for Appearance Need Assistance ?Use T oilet Need Assistance ?Bath / Shower Need Assistance ?Dress Self Need Assistance ?Feed Self Completely Able ?Walk Need Assistance ?Get In / Out Bed Completely Able ?Housework Need Assistance ?Prepare Meals Need Assistance ?Handle Money Need Assistance ?Shop for Self Need Assistance ?Electronic Signature(s) ?Signed: 02/07/2022 4:26:43 PM By: Antonieta Iba ?Entered By: Antonieta Iba on  02/07/2022 13:23:59 ?-------------------------------------------------------------------------------- ?Education Screening Details ?Patient Name: ?Date of Service: ?Darren [name], Darren [name] 02/07/2022 1:15 PM ?Medical Record Number: [id number] ?Patient Account Number: [id number] ?Date of Birth/Sex: ?Treating RN: ?[birth year] (88 y.o. [name] ?Primary Care Misty Rago: Tana Conch ?Other Clinician: ?Referring Bari Handshoe: ?Treating Ladiamond Gallina/Extender: Geralyn Corwin ?Tana Conch ?Weeks in Treatment: 0 ?Primary Learner Assessed: Patient ?Learning Preferences/Education Level/Primary Language ?Learning Preference: Explanation, Demonstration, Printed Material ?Highest Education Level: College or Above ?Preferred Language: English ?Cognitive Barrier ?Language Barrier: No ?Translator Needed: No ?Memory Deficit: Yes Parkinsons, CVA ?Emotional Barrier: No ?Cultural/Religious Beliefs Affecting Medical Care: No ?Physical Barrier ?Impaired Vision: Yes Glasses ?Impaired Hearing: No ?Decreased Hand dexterity: No ?Knowledge/Comprehension ?Knowledge Level: Medium ?Comprehension Level: Medium ?Ability to understand written instructions: Medium ?Ability to understand verbal instructions: Medium ?Motivation ?Anxiety Level: Calm ?Cooperation: Cooperative ?Education Importance: Acknowledges Need ?Interest in Health Problems: Asks Questions ?Perception: Coherent ?Willingness to Engage in Self-Management High ?Activities: ?Readiness to Engage in Self-Management High ?Activities: ?Electronic Signature(s) ?Signed: 02/07/2022 4:26:43 PM By: Antonieta Iba ?Entered By: Antonieta Iba on 02/07/2022 13:24:49 ?-------------------------------------------------------------------------------- ?Fall Risk Assessment Details ?Patient Name: ?Date of Service: ?Darren [name], Darren [name] 02/07/2022 1:15 PM ?Medical Record Number: [id number] ?Patient Account Number: [id number] ?Date of Birth/Sex: ?Treating RN: ?1933-12-11 (88 y.o. [name] ?Primary Care  Gradie Butrick: Tana Conch ?Other Clinician: ?Referring Ahley Bulls: ?Treating Lucan Riner/Extender: Geralyn Corwin ?Tana Conch ?Weeks in Treatment: 0 ?Fall Risk Assessment Items ?Have you had 2 or more falls in the last 12 monthso 0 No ?Have you had any fall that resulted in injury in the last 12 monthso 0 Yes ?FALLS RISK SCREEN ?History of falling - immediate or within 3 months 25 Yes ?Secondary diagnosis (Do you have 2 or more medical diagnoseso) 0 No ?Ambulatory aid ?None/bed rest/wheelchair/nurse 0 No ?Crutches/cane/walker 15 Yes ?Furniture 0 No ?Intravenous therapy Access/Saline/Heparin Lock 0 No ?Gait/Transferring ?Normal/ bed rest/ wheelchair 0 No ?Weak (short steps with or without  shuffle, stooped but able to lift head while walking, may seek 10 Yes ?support from furniture) ?Impaired (short steps with shuffle, may have difficulty arising from chair, head down, impaired 0 No ?balance) ?Mental Status ?Oriented to own ability 0 Yes ?Electronic Signature(s) ?Signed: 02/07/2022 4:26:43 PM By: Antonieta Iba ?Entered By: Antonieta Iba on 02/07/2022 13:25:30 ?-------------------------------------------------------------------------------- ?Foot Assessment Details ?Patient Name: ?Date of Service: ?Darren [name], Darren [name] 02/07/2022 1:15 PM ?Medical Record Number: [id number] ?Patient Account Number: [id number] ?Date of Birth/Sex: ?Treating RN: ?1934-03-18 (88 y.o. [name] ?Primary Care Shadee Rathod: Tana Conch ?Other Clinician: ?Referring Amandeep Nesmith: ?Treating Gabriele Loveland/Extender: Geralyn Corwin ?Tana Conch ?Weeks in Treatment: 0 ?Foot Assessment Items ?Site Locations ?+ = Sensation present, - = Sensation absent, C = Callus, U = Ulcer ?R = Redness, W = Warmth, M = Maceration, PU = Pre-ulcerative lesion ?F = Fissure, S = Swelling, D = Dryness ?Assessment ?Right: Left: ?Other Deformity: No No ?Prior Foot Ulcer: No No ?Prior Amputation: No No ?Charcot Joint: No No ?Ambulatory Status: ?Gait: ?Notes ?N/A: arm  wounds ?Electronic Signature(s) ?Signed: 02/07/2022 1:52:27 PM By: Antonieta Iba ?Entered By: Antonieta Iba on 02/07/2022 13:52:27 ?-------------------------------------------------------------------------------- ?Nutrition Risk Screening Details ?Patient Name: ?Date of Service: ?Darren [name], Darren [name] 02/07/2022 1:15 PM ?Medical Record Number: [id number] ?Patient Account Number: [id number] ?Date of Birth/Sex: ?Treating RN: ?08/13/1934 (88 y.o. [name] ?Primary Care Milos Milligan: Tana Conch ?Other Clinician: ?Referring Kamorah Nevils: ?Treating Courteny Egler/Extender: Geralyn Corwin ?Tana Conch ?Weeks in Treatment: 0 ?Height (in): 70 ?Weight (lbs): 160 ?Body Mass Index (BMI): 23 ?Nutrition Risk Screening Items ?Score Screening ?NUTRITION RISK SCREEN: ?I have an illness or condition that made me change the kind and/or amount of food I eat 0 No ?I eat fewer than two meals per day 0 No ?I eat few fruits and vegetables, or milk products 0 No ?I have three or more drinks of beer, liquor or wine almost every day 0 No ?I have tooth or mouth problems that make it hard for me to eat 0 No ?I don't always have enough money to buy the food I need 0 No ?I eat alone most of the time 0 No ?I take three or more different prescribed or over-the-counter drugs a day 1 Yes ?Without wanting to, I have lost or gained 10 pounds in the last six months 0 No ?I am not always physically able to shop, cook and/or feed myself 0 No ?Nutrition Protocols ?Good Risk Protocol 0 No interventions needed ?Moderate Risk Protocol ?High Risk Proctocol ?Risk Level: Good Risk ?Score: 1 ?Electronic Signature(s) ?Signed: 02/07/2022 4:26:43 PM By: Antonieta Iba ?Entered By: Antonieta Iba on 02/07/2022 13:25:42 ?

## 2022-02-17 ENCOUNTER — Encounter (HOSPITAL_BASED_OUTPATIENT_CLINIC_OR_DEPARTMENT_OTHER): Payer: Medicare Other | Admitting: Internal Medicine

## 2022-02-17 DIAGNOSIS — Z8673 Personal history of transient ischemic attack (TIA), and cerebral infarction without residual deficits: Secondary | ICD-10-CM | POA: Diagnosis not present

## 2022-02-17 DIAGNOSIS — G20 Parkinson's disease: Secondary | ICD-10-CM | POA: Diagnosis not present

## 2022-02-17 DIAGNOSIS — L98492 Non-pressure chronic ulcer of skin of other sites with fat layer exposed: Secondary | ICD-10-CM

## 2022-02-17 DIAGNOSIS — S41101A Unspecified open wound of right upper arm, initial encounter: Secondary | ICD-10-CM

## 2022-02-17 NOTE — Progress Notes (Signed)
Darren [name], Darren [name] ([id number]) ?Visit Report for 02/17/2022 ?Arrival Information Details ?Patient Name: Date of Service: ?[name], [name] 02/17/2022 8:45 A M ?Medical Record Number: [id number] ?Patient Account Number: [id number] ?Date of Birth/Sex: Treating RN: ?[birth year] (88 y.o. Darren [name] ?Primary Care Lilyannah Zuelke: Tana Conch Other Clinician: ?Referring Ronen Bromwell: ?Treating Truong Delcastillo/Extender: Geralyn Corwin ?Tana Conch ?Weeks in Treatment: 1 ?Visit Information History Since Last Visit ?Added or deleted any medications: No ?Patient Arrived: Ambulatory ?Any new allergies or adverse reactions: No ?Arrival Time: 08:58 ?Had a fall or experienced change in No ?Accompanied By: wife, son ?activities of daily living that may affect ?Transfer Assistance: None ?risk of falls: ?Patient Identification Verified: Yes ?Signs or symptoms of abuse/neglect since last visito No ?Secondary Verification Process Completed: Yes ?Hospitalized since last visit: No ?Patient Requires Transmission-Based Precautions: No ?Implantable device outside of the clinic excluding No ?Patient Has Alerts: Yes ?cellular tissue based products placed in the center ?Patient Alerts: Patient on Blood Thinner since last visit: ?Has Dressing in Place as Prescribed: Yes ?Pain Present Now: No ?Electronic Signature(s) ?Signed: 02/17/2022 4:12:07 PM By: Antonieta Iba ?Entered By: Antonieta Iba on 02/17/2022 08:59:22 ?-------------------------------------------------------------------------------- ?Clinic Level of Care Assessment Details ?Patient Name: Date of Service: ?Darren [name], Darren M R. 02/17/2022 8:45 A M ?Medical Record Number: [id number] ?Patient Account Number: [id number] ?Date of Birth/Sex: Treating RN: ?01/17/1934 (88 y.o. Darren [name] ?Primary Care Ellen Goris: Tana Conch Other Clinician: ?Referring Naida Escalante: ?Treating Morganne Haile/Extender: Geralyn Corwin ?Tana Conch ?Weeks in Treatment: 1 ?Clinic Level of Care Assessment Items ?TOOL 4  Quantity Score ?X- 1 0 ?Use when only an EandM is performed on FOLLOW-UP visit ?ASSESSMENTS - Nursing Assessment / Reassessment ?X- 1 10 ?Reassessment of Co-morbidities (includes updates in patient status) ?X- 1 5 ?Reassessment of Adherence to Treatment Plan ?ASSESSMENTS - Wound and Skin A ssessment / Reassessment ?[]  - 0 ?Simple Wound Assessment / Reassessment - one wound ?X- 3 5 ?Complex Wound Assessment / Reassessment - multiple wounds ?[]  - 0 ?Dermatologic / Skin Assessment (not related to wound area) ?ASSESSMENTS - Focused Assessment ?[]  - 0 ?Circumferential Edema Measurements - multi extremities ?[]  - 0 ?Nutritional Assessment / Counseling / Intervention ?[]  - 0 ?Lower Extremity Assessment (monofilament, tuning fork, pulses) ?[]  - 0 ?Peripheral Arterial Disease Assessment (using hand held doppler) ?ASSESSMENTS - Ostomy and/or Continence Assessment and Care ?[]  - 0 ?Incontinence Assessment and Management ?[]  - 0 ?Ostomy Care Assessment and Management (repouching, etc.) ?PROCESS - Coordination of Care ?[]  - 0 ?Simple Patient / Family Education for ongoing care ?X- 1 20 ?Complex (extensive) Patient / Family Education for ongoing care ?[]  - 0 ?Staff obtains Consents, Records, T Results / Process Orders ?est ?X- 1 10 ?Staff telephones HHA, Nursing Homes / Clarify orders / etc ?[]  - 0 ?Routine Transfer to another Facility (non-emergent condition) ?[]  - 0 ?Routine Hospital Admission (non-emergent condition) ?[]  - 0 ?New Admissions / / Ordering NPWT Apligraf, etc. ?, ?[]  - 0 ?Emergency Hospital Admission (emergent condition) ?[]  - 0 ?Simple Discharge Coordination ?[]  - 0 ?Complex (extensive) Discharge Coordination ?PROCESS - Special Needs ?[]  - 0 ?Pediatric / Minor Patient Management ?[]  - 0 ?Isolation Patient Management ?[]  - 0 ?Hearing / Language / Visual special needs ?[]  - 0 ?Assessment of Community assistance (transportation, D/C planning, etc.) ?[]  - 0 ?Additional assistance / Altered  mentation ?[]  - 0 ?Support Surface(s) Assessment (bed, cushion, seat, etc.) ?INTERVENTIONS - Wound Cleansing / Measurement ?[]  - 0 ?Simple Wound Cleansing - one wound ?X- 3 5 ?Complex Wound  Cleansing - multiple wounds ?[]  - 0 ?Wound Imaging (photographs - any number of wounds) ?X- 1 5 ?Wound Tracing (instead of photographs) ?[]  - 0 ?Simple Wound Measurement - one wound ?X- 3 5 ?Complex Wound Measurement - multiple wounds ?INTERVENTIONS - Wound Dressings ?X - Small Wound Dressing one or multiple wounds 3 10 ?[]  - 0 ?Medium Wound Dressing one or multiple wounds ?[]  - 0 ?Large Wound Dressing one or multiple wounds ?[]  - 0 ?Application of Medications - topical ?[]  - 0 ?Application of Medications - injection ?INTERVENTIONS - Miscellaneous ?[]  - 0 ?External ear exam ?[]  - 0 ?Specimen Collection (cultures, biopsies, blood, body fluids, etc.) ?[]  - 0 ?Specimen(s) / Culture(s) sent or taken to Lab for analysis ?[]  - 0 ?Patient Transfer (multiple staff / / Similar devices) ?[]  - 0 ?Simple Staple / Suture removal (25 or less) ?[]  - 0 ?Complex Staple / Suture removal (26 or more) ?[]  - 0 ?Hypo / Hyperglycemic Management (close monitor of Blood Glucose) ?[]  - 0 ?Ankle / Brachial Index (ABI) - do not check if billed separately ?X- 1 5 ?Vital Signs ?Has the patient been seen at the hospital within the last three years: Yes ?Total Score: 130 ?Level Of Care: New/Established - Level 4 ?Electronic Signature(s) ?Signed: 02/17/2022 4:12:07 PM By: ?Entered By: on 02/17/2022 09:34:24 ?-------------------------------------------------------------------------------- ?Encounter Discharge Information Details ?Patient Name: Date of Service: ?Darren [name], Darren M R. 02/17/2022 8:45 A M ?Medical Record Number: ?Patient Account Number: ?Date of Birth/Sex: Treating RN: ?1934-05-27 (88 y.o. ?Primary Care Jenafer Winterton: Other Clinician: ?Referring Aeson Sawyers: ?Treating  Shavawn Stobaugh/Extender: ?02/19/2022 ?Weeks in Treatment: 1 ?Encounter Discharge Information Items ?Discharge Condition: Stable ?Ambulatory Status: Ambulatory ?Discharge Destination: Home ?Transportation: Private Auto ?Accompanied By: wife, son ?Schedule Follow-up Appointment: Yes ?Clinical Summary of Care: Provided on 02/17/2022 ?Form Type Recipient ?Paper Patient Patient ?Electronic Signature(s) ?Signed: 02/17/2022 4:12:07 PM By: 02/19/2022 ?Entered By: Arma Heading on 02/17/2022 09:46:13 ?-------------------------------------------------------------------------------- ?Lower Extremity Assessment Details ?Patient Name: Date of Service: ?Darren [name], Darren M R. 02/17/2022 8:45 A M ?Medical Record Number: 11/09/1934 ?Patient Account Number: [id number] ?Date of Birth/Sex: Treating RN: ?August 23, 1934 (88 y.o. Geralyn Corwin ?Primary Care Shaylea Ucci: Tana Conch Other Clinician: ?Referring Dellie Piasecki: ?Treating Elany Darren [name]/Extender: 02/19/2022 ?02/19/2022 ?Weeks in Treatment: 1 ?Electronic Signature(s) ?Signed: 02/17/2022 4:12:07 PM By: Antonieta Iba ?Entered By: 02/19/2022 on 02/17/2022 08:59:43 ?-------------------------------------------------------------------------------- ?Multi Wound Chart Details ?Patient Name: ?Date of Service: ?Darren [name], Darren M R. 02/17/2022 8:45 A M ?Medical Record Number: [id number] ?Patient Account Number: 11/09/1934 ?Date of Birth/Sex: ?Treating RN: ?1934/08/21 (88 y.o. Tana Conch ?Primary Care Carena Stream: Geralyn Corwin ?Other Clinician: ?Referring Evertt Chouinard: ?Treating Rashan Patient/Extender: Tana Conch ?02/19/2022 ?Weeks in Treatment: 1 ?Vital Signs ?Height(in): 70 ?Pulse(bpm): 66 ?Weight(lbs): 160 ?Blood Pressure(mmHg): 141/65 ?Body Mass Index(BMI): 23 ?Temperature(??F): 97.9 ?Respiratory Rate(breaths/min): 18 ?Photos: ?Right, Medial Forearm Right Upper Arm Right Wrist ?Wound Location: ?Trauma Trauma Trauma ?Wounding Event: ?Trauma, Other Trauma, Other  Trauma, Other ?Primary Etiology: ?Cataracts, Asthma, Hypertension, Cataracts, Asthma, Hypertension, Cataracts, Asthma, Hypertension, ?Comorbid History: ?Osteoarthritis Osteoarthritis Osteoarthritis ?01/15/2022

## 2022-02-17 NOTE — Progress Notes (Signed)
MACIO, KISSOON ([id number]) ?Visit Report for 02/17/2022 ?Chief Complaint Document Details ?Patient Name: Date of Service: ?JAZIR, NEWEY M R. 02/17/2022 8:45 A M ?Medical Record Number: [id number] ?Patient Account Number: [id number] ?Date of Birth/Sex: Treating RN: ?08/06/1934 (88 y.o. [name] ?Primary Care Provider: Tana Conch Other Clinician: ?Referring Provider: ?Treating Provider/Extender: Geralyn Corwin ?Tana Conch ?Weeks in Treatment: 1 ?Information Obtained from: Patient ?Chief Complaint ?02/07/2022; right upper extremity wounds following trauma ?Electronic Signature(s) ?Signed: 02/17/2022 12:18:30 PM By: Geralyn Corwin DO ?Entered By: Geralyn Corwin on 02/17/2022 09:38:35 ?-------------------------------------------------------------------------------- ?HPI Details ?Patient Name: Date of Service: ?VERMON, GRAYS M R. 02/17/2022 8:45 A M ?Medical Record Number: [id number] ?Patient Account Number: [id number] ?Date of Birth/Sex: Treating RN: ?[birth year] (88 y.o. [name] ?Primary Care Provider: Tana Conch Other Clinician: ?Referring Provider: ?Treating Provider/Extender: Geralyn Corwin ?Tana Conch ?Weeks in Treatment: 1 ?History of Present Illness ?HPI Description: Admission 02/07/2022 ?Mr. Sai Zinn is an 88 year old male with a past medical history of CVA, essential hypertension and Parkinson's disease that presents to the clinic for a 1 ?month history of wounds to his right upper extremity. On 01/15/2022 he visited the ED because he had a fall and hit his head and had multiple skin tears to his ?right arm. He had x-rays of the right humerus and right wrist without any acute fractures. He had a CT of the head with no intercranial abnormality. He has ?been followed by his family practice for this issue. He was last seen on 01/25/2022 and prescribed Keflex for potential cellulitis to the wrist wound. He has been ?using Mepitel and changing the outer dressing every couple  days. He currently denies signs of infection. ?3/30; patient presents for follow-up. He has been using Hydrofera Blue to the right wrist wound and Xeroform to the right upper extremity wounds. He has no ?issues or complaints today. He denies signs of infection. ?Electronic Signature(s) ?Signed: 02/17/2022 12:18:30 PM By: Geralyn Corwin DO ?Entered By: Geralyn Corwin on 02/17/2022 09:39:16 ?-------------------------------------------------------------------------------- ?Physical Exam Details ?Patient Name: Date of Service: ?AKEEN, LEDYARD M R. 02/17/2022 8:45 A M ?Medical Record Number: [id number] ?Patient Account Number: [id number] ?Date of Birth/Sex: Treating RN: ?[birth year] (88 y.o. [name] ?Primary Care Provider: Tana Conch Other Clinician: ?Referring Provider: ?Treating Provider/Extender: Geralyn Corwin ?Tana Conch ?Weeks in Treatment: 1 ?Constitutional ?respirations regular, non-labored and within target range for patient.Marland Kitchen ?Psychiatric ?pleasant and cooperative. ?Notes ?Right upper extremity: There are 3 open wounds with granulation tissue throughout. These are located to the right medial forearm, right upper arm and right ?wrist. No surrounding signs of infection. Appears well-healing. ?Electronic Signature(s) ?Signed: 02/17/2022 12:18:30 PM By: Geralyn Corwin DO ?Entered By: Geralyn Corwin on 02/17/2022 09:40:33 ?-------------------------------------------------------------------------------- ?Physician Orders Details ?Patient Name: Date of Service: ?REFUJIO, HAYMER M R. 02/17/2022 8:45 A M ?Medical Record Number: [id number] ?Patient Account Number: [id number] ?Date of Birth/Sex: Treating RN: ?February 05, 1934 (88 y.o. [name] ?Primary Care Provider: Tana Conch Other Clinician: ?Referring Provider: ?Treating Provider/Extender: Geralyn Corwin ?Tana Conch ?Weeks in Treatment: 1 ?Verbal / Phone Orders: No ?Diagnosis Coding ?Follow-up Appointments ?ppointment in 1 week. - with  Dr. Mikey Bussing [facility], Room 7) ?Return A ?Bathing/ Shower/ Hygiene ?May shower and wash wound with soap and water. - when changing dressing ?Home Health ?Other Home Health Orders/Instructions: - [facility]: Skilled nursing for wound care 2-3x week. ?Wound Treatment ?Wound #1 - Forearm Wound Laterality: Right, Medial ?Cleanser: Soap and Water [facility]) Every Other Day/30 Days ?Discharge Instructions: May shower and wash wound with dial antibacterial  soap and water prior to dressing change. ?Cleanser: Wound Cleanser [facility]) Every Other Day/30 Days ?Discharge Instructions: Cleanse the wound with wound cleanser prior to applying a clean dressing using gauze sponges, not tissue or cotton balls. ?Prim Dressing: Xeroform Occlusive Gauze Dressing, 4x4 in [facility]) Every Other Day/30 Days ?ary ?Discharge Instructions: Apply to wound bed as instructed ?Secondary Dressing: Woven Gauze Sponge, Non-Sterile 4x4 in [facility]) Every Other Day/30 Days ?Discharge Instructions: Apply over primary dressing as directed. ?Secured With: [employer], 4.5x3.1 (in/yd) [facility]) Every Other Day/30 Days ?Discharge Instructions: Secure with Kerlix as directed. ?Secured With: 38M Medipore H Soft Cloth Surgical T ape, 4 x 10 (in/yd) (Home Health) Every Other Day/30 Days ?Discharge Instructions: Secure with tape as directed. ?Add-Ons: Stetch Netting Every Other Day/30 Days ?Wound #2 - Upper Arm Wound Laterality: Right ?Cleanser: Soap and Water [facility]) Every Other Day/30 Days ?Discharge Instructions: May shower and wash wound with dial antibacterial soap and water prior to dressing change. ?Cleanser: Wound Cleanser [facility]) Every Other Day/30 Days ?Discharge Instructions: Cleanse the wound with wound cleanser prior to applying a clean dressing using gauze sponges, not tissue or cotton balls. ?Prim Dressing: Xeroform Occlusive Gauze Dressing, 4x4 in [facility]) Every Other Day/30 Days ?ary ?Discharge  Instructions: Apply to wound bed as instructed ?Secondary Dressing: Woven Gauze Sponge, Non-Sterile 4x4 in [facility]) Every Other Day/30 Days ?Discharge Instructions: Apply over primary dressing as directed. ?Secured With: [employer], 4.5x3.1 (in/yd) [facility]) Every Other Day/30 Days ?Discharge Instructions: Secure with Kerlix as directed. ?Secured With: 38M Medipore H Soft Cloth Surgical T ape, 4 x 10 (in/yd) (Home Health) Every Other Day/30 Days ?Discharge Instructions: Secure with tape as directed. ?Add-Ons: Orthoptist [facility]) Every Other Day/30 Days ?Wound #3 - Wrist Wound Laterality: Right ?Cleanser: Soap and Water [facility]) Every Other Day/30 Days ?Discharge Instructions: May shower and wash wound with dial antibacterial soap and water prior to dressing change. ?Cleanser: Wound Cleanser [facility]) Every Other Day/30 Days ?Discharge Instructions: Cleanse the wound with wound cleanser prior to applying a clean dressing using gauze sponges, not tissue or cotton balls. ?Prim Dressing: Hydrofera Blue Ready Foam, 2.5 x2.5 in [facility]) Every Other Day/30 Days ?ary ?Discharge Instructions: Apply to wound bed as instructed ?Secondary Dressing: Woven Gauze Sponge, Non-Sterile 4x4 in [facility]) Every Other Day/30 Days ?Discharge Instructions: Apply over primary dressing as directed. ?Secured With: [employer], 4.5x3.1 (in/yd) [facility]) Every Other Day/30 Days ?Discharge Instructions: Secure with Kerlix as directed. ?Secured With: 38M Medipore H Soft Cloth Surgical T ape, 4 x 10 (in/yd) (Home Health) Every Other Day/30 Days ?Discharge Instructions: Secure with tape as directed. ?Add-Ons: Orthoptist [facility]) Every Other Day/30 Days ?Electronic Signature(s) ?Signed: 02/17/2022 12:18:30 PM By: Geralyn Corwin DO ?Entered By: Geralyn Corwin on 02/17/2022 09:40:51 ?-------------------------------------------------------------------------------- ?Problem List  Details ?Patient Name: ?Date of Service: ?FAUSTO, SAMPEDRO M R. 02/17/2022 8:45 A M ?Medical Record Number: [id number] ?Patient Account Number: [id number] ?Date of Birth/Sex: ?Treating RN: ?06-10-1934 (88 y.o. [name] ?

## 2022-02-24 ENCOUNTER — Encounter (HOSPITAL_BASED_OUTPATIENT_CLINIC_OR_DEPARTMENT_OTHER): Payer: Medicare Other | Attending: Internal Medicine | Admitting: Internal Medicine

## 2022-02-24 DIAGNOSIS — G20 Parkinson's disease: Secondary | ICD-10-CM | POA: Insufficient documentation

## 2022-02-24 DIAGNOSIS — L98492 Non-pressure chronic ulcer of skin of other sites with fat layer exposed: Secondary | ICD-10-CM | POA: Insufficient documentation

## 2022-02-24 DIAGNOSIS — Z8673 Personal history of transient ischemic attack (TIA), and cerebral infarction without residual deficits: Secondary | ICD-10-CM | POA: Insufficient documentation

## 2022-02-24 DIAGNOSIS — I10 Essential (primary) hypertension: Secondary | ICD-10-CM | POA: Diagnosis not present

## 2022-02-24 DIAGNOSIS — Z09 Encounter for follow-up examination after completed treatment for conditions other than malignant neoplasm: Secondary | ICD-10-CM | POA: Insufficient documentation

## 2022-02-24 DIAGNOSIS — W19XXXA Unspecified fall, initial encounter: Secondary | ICD-10-CM | POA: Diagnosis not present

## 2022-02-24 DIAGNOSIS — S41101A Unspecified open wound of right upper arm, initial encounter: Secondary | ICD-10-CM | POA: Diagnosis not present

## 2022-02-24 NOTE — Progress Notes (Signed)
Darren [name] ([id number]) ?Visit Report for 02/24/2022 ?Chief Complaint Document Details ?Patient Name: Date of Service: ?[name], [name] 02/24/2022 8:30 A M ?Medical Record Number: [id number] ?Patient Account Number: [id number] ?Date of Birth/Sex: Treating RN: ?Apr 12, 1934 (88 y.o. Darren [name] ?Primary Care Provider: Tana Conch Other Clinician: ?Referring Provider: ?Treating Provider/Extender: Geralyn Corwin ?Tana Conch ?Weeks in Treatment: 2 ?Information Obtained from: Patient ?Chief Complaint ?02/07/2022; right upper extremity wounds following trauma ?Electronic Signature(s) ?Signed: 02/24/2022 12:11:35 PM By: Geralyn Corwin DO ?Entered By: Geralyn Corwin on 02/24/2022 10:40:59 ?-------------------------------------------------------------------------------- ?HPI Details ?Patient Name: Date of Service: ?[name], [name] 02/24/2022 8:30 A M ?Medical Record Number: [id number] ?Patient Account Number: [id number] ?Date of Birth/Sex: Treating RN: ?1934/05/30 (88 y.o. Darren [name] ?Primary Care Provider: Tana Conch Other Clinician: ?Referring Provider: ?Treating Provider/Extender: Geralyn Corwin ?Tana Conch ?Weeks in Treatment: 2 ?History of Present Illness ?HPI Description: Admission 02/07/2022 ?Darren [name] is an 88 year old male with a past medical history of CVA, essential hypertension and Parkinson's disease that presents to the clinic for a 1 ?month history of wounds to his right upper extremity. On 01/15/2022 he visited the ED because he had a fall and hit his head and had multiple skin tears to his ?right arm. He had x-rays of the right humerus and right wrist without any acute fractures. He had a CT of the head with no intercranial abnormality. He has ?been followed by his family practice for this issue. He was last seen on 01/25/2022 and prescribed Keflex for potential cellulitis to the wrist wound. He has been ?using Mepitel and changing the outer dressing every couple days. He  currently denies signs of infection. ?3/30; patient presents for follow-up. He has been using Hydrofera Blue to the right wrist wound and Xeroform to the right upper extremity wounds. He has no ?issues or complaints today. He denies signs of infection. ?4/6; patient presents for follow-up. Patient has been using Xeroform and Hydrofera Blue to the wound sites with benefit. He has no issues or complaints today. ?Electronic Signature(s) ?Signed: 02/24/2022 12:11:35 PM By: Geralyn Corwin DO ?Entered By: Geralyn Corwin on 02/24/2022 10:41:41 ?-------------------------------------------------------------------------------- ?Physical Exam Details ?Patient Name: Date of Service: ?[name], [name] 02/24/2022 8:30 A M ?Medical Record Number: [id number] ?Patient Account Number: [id number] ?Date of Birth/Sex: Treating RN: ?03/19/1934 (88 y.o. Darren [name] ?Primary Care Provider: Tana Conch Other Clinician: ?Referring Provider: ?Treating Provider/Extender: Geralyn Corwin ?Tana Conch ?Weeks in Treatment: 2 ?Constitutional ?respirations regular, non-labored and within target range for patient.Marland Kitchen ?Psychiatric ?pleasant and cooperative. ?Notes ?Right upper extremity: Epithelization to the previous wound sites. Surrounding skin is intact. No signs of infection. ?Electronic Signature(s) ?Signed: 02/24/2022 12:11:35 PM By: Geralyn Corwin DO ?Entered By: Geralyn Corwin on 02/24/2022 10:42:34 ?-------------------------------------------------------------------------------- ?Physician Orders Details ?Patient Name: Date of Service: ?[name], [name] 02/24/2022 8:30 A M ?Medical Record Number: [id number] ?Patient Account Number: [id number] ?Date of Birth/Sex: Treating RN: ?1934/10/30 (88 y.o. Darren [name] ?Primary Care Provider: Tana Conch Other Clinician: ?Referring Provider: ?Treating Provider/Extender: Geralyn Corwin ?Tana Conch ?Weeks in Treatment: 2 ?Verbal / Phone Orders: No ?Diagnosis Coding ?ICD-10  Coding ?Code Description ?L98.492 Non-pressure chronic ulcer of skin of other sites with fat layer exposed ?S41.101A Unspecified open wound of right upper arm, initial encounter ?G20 Parkinson's disease ?J69.67 Personal history of transient ischemic attack (TIA), and cerebral infarction without residual deficits ?I10 Essential (primary) hypertension ?Discharge From [facility] ?Discharge from W[facility] Care Center - No further follow up needed, call is anything changes. ?  Non Wound Condition ?Protect area with: - moisturizing lotion and Vaseline 1-2x daily ?Home Health ?Discontinue home health for wound care. - DC [facility], patient healed ?Electronic Signature(s) ?Signed: 02/24/2022 12:11:35 PM By: Geralyn Corwin DO ?Signed: 02/24/2022 5:51:15 PM By: Antonieta Iba ?Entered By: Antonieta Iba on 02/24/2022 11:37:08 ?-------------------------------------------------------------------------------- ?Problem List Details ?Patient Name: Date of Service: ?[name], [name] 02/24/2022 8:30 A M ?Medical Record Number: [id number] ?Patient Account Number: [id number] ?Date of Birth/Sex: Treating RN: ?1934/05/27 (88 y.o. Darren [name] ?Primary Care Provider: Tana Conch Other Clinician: ?Referring Provider: ?Treating Provider/Extender: Geralyn Corwin ?Tana Conch ?Weeks in Treatment: 2 ?Active Problems ?ICD-10 ?Encounter ?Code Description Active Date MDM ?Diagnosis ?I09.735 Non-pressure chronic ulcer of skin of other sites with fat layer exposed 02/07/2022 No Yes ?S41.101A Unspecified open wound of right upper arm, initial encounter 02/07/2022 No Yes ?G20 Parkinson's disease 02/07/2022 No Yes ?H29.92 Personal history of transient ischemic attack (TIA), and cerebral infarction 02/07/2022 No Yes ?without residual deficits ?I10 Essential (primary) hypertension 02/07/2022 No Yes ?Inactive Problems ?Resolved Problems ?Electronic Signature(s) ?Signed: 02/24/2022 12:11:35 PM By: Geralyn Corwin DO ?Entered By: Geralyn Corwin on  02/24/2022 10:39:49 ?-------------------------------------------------------------------------------- ?Progress Note Details ?Patient Name: Date of Service: ?[name], [name] 02/24/2022 8:30 A M ?Medical Record Number: [id number] ?Patient Account Number: [id number] ?Date of Birth/Sex: Treating RN: ?[birth year] (88 y.o. Darren [name] ?Primary Care Provider: Tana Conch Other Clinician: ?Referring Provider: ?Treating Provider/Extender: Geralyn Corwin ?Tana Conch ?Weeks in Treatment: 2 ?Subjective ?Chief Complaint ?Information obtained from Patient ?02/07/2022; right upper extremity wounds following trauma ?History of Present Illness (HPI) ?Admission 02/07/2022 ?Mr. Lizzie Cokley is an 88 year old male with a past medical history of CVA, essential hypertension and Parkinson's disease that presents to the clinic for a 1 ?month history of wounds to his right upper extremity. On 01/15/2022 he visited the ED because he had a fall and hit his head and had multiple skin tears to his ?right arm. He had x-rays of the right humerus and right wrist without any acute fractures. He had a CT of the head with no intercranial abnormality. He has ?been followed by his family practice for this issue. He was last seen on 01/25/2022 and prescribed Keflex for potential cellulitis to the wrist wound. He has been ?using Mepitel and changing the outer dressing every couple days. He currently denies signs of infection. ?3/30; patient presents for follow-up. He has been using Hydrofera Blue to the right wrist wound and Xeroform to the right upper extremity wounds. He has no ?issues or complaints today. He denies signs of infection. ?4/6; patient presents for follow-up. Patient has been using Xeroform and Hydrofera Blue to the wound sites with benefit. He has no issues or complaints today. ?Patient History ?Information obtained from Patient, Caregiver. ?Family History ?Cancer - Siblings, Heart Disease - Father, Hypertension - Father, ?No  family history of Diabetes, Hereditary Spherocytosis, Kidney Disease, Lung Disease, Seizures, Stroke, Thyroid Problems, Tuberculosis. ?Social History ?Former smoker, Marital Status - Married, Alcohol Use

## 2022-02-24 NOTE — Progress Notes (Signed)
NAGUAN, MAZON ([id number]) ?Visit Report for 02/24/2022 ?Arrival Information Details ?Patient Name: Date of Service: ?Thom, Darren Bishop M R. 02/24/2022 8:30 A M ?Medical Record Number: [id number] ?Patient Account Number: [id number] ?Date of Birth/Sex: Treating RN: ?[birth year] (88 y.o. Darren [name] ?Primary Care Alyna Stensland: Garret Reddish Other Clinician: ?Referring Reshanda Lewey: ?Treating Raeya Merritts/Extender: Kalman Shan ?Garret Reddish ?Weeks in Treatment: 2 ?Visit Information History Since Last Visit ?Added or deleted any medications: No ?Patient Arrived: Kasandra Knudsen ?Any new allergies or adverse reactions: No ?Arrival Time: 09:08 ?Had a fall or experienced change in No ?Accompanied By: wife, son ?activities of daily living that may affect ?Transfer Assistance: None ?risk of falls: ?Patient Identification Verified: Yes ?Signs or symptoms of abuse/neglect since last visito No ?Secondary Verification Process Completed: Yes ?Hospitalized since last visit: No ?Patient Requires Transmission-Based Precautions: No ?Implantable device outside of the clinic excluding No ?Patient Has Alerts: Yes ?cellular tissue based products placed in the center ?Patient Alerts: Patient on Blood Thinner since last visit: ?Has Dressing in Place as Prescribed: Yes ?Pain Present Now: No ?Electronic Signature(s) ?Signed: 02/24/2022 5:51:15 PM By: Lorrin Jackson ?Entered By: Lorrin Jackson on 02/24/2022 09:10:55 ?-------------------------------------------------------------------------------- ?Clinic Level of Care Assessment Details ?Patient Name: Date of Service: ?Deleo, Darren Bishop M R. 02/24/2022 8:30 A M ?Medical Record Number: [id number] ?Patient Account Number: [id number] ?Date of Birth/Sex: Treating RN: ?[birth year] (88 y.o. Darren [name] ?Primary Care Nyemah Watton: Garret Reddish Other Clinician: ?Referring Katlyn Muldrew: ?Treating Vanden Fawaz/Extender: Kalman Shan ?Garret Reddish ?Weeks in Treatment: 2 ?Clinic Level of Care Assessment Items ?TOOL 4 Quantity  Score ?X- 1 0 ?Use when only an EandM is performed on FOLLOW-UP visit ?ASSESSMENTS - Nursing Assessment / Reassessment ?X- 1 10 ?Reassessment of Co-morbidities (includes updates in patient status) ?X- 1 5 ?Reassessment of Adherence to Treatment Plan ?ASSESSMENTS - Wound and Skin A ssessment / Reassessment ?X - Simple Wound Assessment / Reassessment - one wound 1 5 ?[]  - 0 ?Complex Wound Assessment / Reassessment - multiple wounds ?[]  - 0 ?Dermatologic / Skin Assessment (not related to wound area) ?ASSESSMENTS - Focused Assessment ?[]  - 0 ?Circumferential Edema Measurements - multi extremities ?[]  - 0 ?Nutritional Assessment / Counseling / Intervention ?[]  - 0 ?Lower Extremity Assessment (monofilament, tuning fork, pulses) ?[]  - 0 ?Peripheral Arterial Disease Assessment (using hand held doppler) ?ASSESSMENTS - Ostomy and/or Continence Assessment and Care ?[]  - 0 ?Incontinence Assessment and Management ?[]  - 0 ?Ostomy Care Assessment and Management (repouching, etc.) ?PROCESS - Coordination of Care ?X - Simple Patient / Family Education for ongoing care 1 15 ?[]  - 0 ?Complex (extensive) Patient / Family Education for ongoing care ?[]  - 0 ?Staff obtains Consents, Records, T Results / Process Orders ?est ?[]  - 0 ?Staff telephones HHA, Nursing Homes / Clarify orders / etc ?[]  - 0 ?Routine Transfer to another Facility (non-emergent condition) ?[]  - 0 ?Routine Hospital Admission (non-emergent condition) ?[]  - 0 ?New Admissions / Biomedical engineer / Ordering NPWT Apligraf, etc. ?, ?[]  - 0 ?Emergency Hospital Admission (emergent condition) ?[]  - 0 ?Simple Discharge Coordination ?[]  - 0 ?Complex (extensive) Discharge Coordination ?PROCESS - Special Needs ?[]  - 0 ?Pediatric / Minor Patient Management ?[]  - 0 ?Isolation Patient Management ?[]  - 0 ?Hearing / Language / Visual special needs ?[]  - 0 ?Assessment of Community assistance (transportation, D/C planning, etc.) ?[]  - 0 ?Additional assistance / Altered  mentation ?[]  - 0 ?Support Surface(s) Assessment (bed, cushion, seat, etc.) ?INTERVENTIONS - Wound Cleansing / Measurement ?[]  - 0 ?Simple Wound Cleansing - one wound ?[]  - 0 ?  Complex Wound Cleansing - multiple wounds ?X- 1 5 ?Wound Imaging (photographs - any number of wounds) ?[]  - 0 ?Wound Tracing (instead of photographs) ?[]  - 0 ?Simple Wound Measurement - one wound ?[]  - 0 ?Complex Wound Measurement - multiple wounds ?INTERVENTIONS - Wound Dressings ?[]  - 0 ?Small Wound Dressing one or multiple wounds ?[]  - 0 ?Medium Wound Dressing one or multiple wounds ?[]  - 0 ?Large Wound Dressing one or multiple wounds ?[]  - 0 ?Application of Medications - topical ?[]  - 0 ?Application of Medications - injection ?INTERVENTIONS - Miscellaneous ?[]  - 0 ?External ear exam ?[]  - 0 ?Specimen Collection (cultures, biopsies, blood, body fluids, etc.) ?[]  - 0 ?Specimen(s) / Culture(s) sent or taken to Lab for analysis ?[]  - 0 ?Patient Transfer (multiple staff / Civil Service fast streamer / Similar devices) ?[]  - 0 ?Simple Staple / Suture removal (25 or less) ?[]  - 0 ?Complex Staple / Suture removal (26 or more) ?[]  - 0 ?Hypo / Hyperglycemic Management (close monitor of Blood Glucose) ?[]  - 0 ?Ankle / Brachial Index (ABI) - do not check if billed separately ?X- 1 5 ?Vital Signs ?Has the patient been seen at the hospital within the last three years: Yes ?Total Score: 45 ?Level Of Care: New/Established - Level 2 ?Electronic Signature(s) ?Signed: 02/24/2022 5:51:15 PM By: Lorrin Jackson ?Entered By: Lorrin Jackson on 02/24/2022 11:33:23 ?-------------------------------------------------------------------------------- ?Encounter Discharge Information Details ?Patient Name: Date of Service: ?Michalec, Darren Bishop M R. 02/24/2022 8:30 A M ?Medical Record Number: [id number] ?Patient Account Number: [id number] ?Date of Birth/Sex: Treating RN: ?November 20, 1934 (88 y.o. Darren [name] ?Primary Care Lott Seelbach: Garret Reddish Other Clinician: ?Referring Dossie Ocanas: ?Treating  Grayton Lobo/Extender: Kalman Shan ?Garret Reddish ?Weeks in Treatment: 2 ?Encounter Discharge Information Items ?Discharge Condition: Stable ?Ambulatory Status: Kasandra Knudsen ?Discharge Destination: Home ?Transportation: Private Auto ?Accompanied By: wife, son ?Schedule Follow-up Appointment: No ?Clinical Summary of Care: Provided on 02/24/2022 ?Form Type Recipient ?Paper Patient Patient ?Electronic Signature(s) ?Signed: 02/24/2022 11:59:16 AM By: Lorrin Jackson ?Entered By: Lorrin Jackson on 02/24/2022 11:59:16 ?-------------------------------------------------------------------------------- ?Lower Extremity Assessment Details ?Patient Name: Date of Service: ?Crihfield, Darren Bishop M R. 02/24/2022 8:30 A M ?Medical Record Number: [id number] ?Patient Account Number: [id number] ?Date of Birth/Sex: Treating RN: ?Mar 16, 1934 (88 y.o. Darren [name] ?Primary Care Ashlen Kiger: Garret Reddish Other Clinician: ?Referring Indy Prestwood: ?Treating Gricel Copen/Extender: Kalman Shan ?Garret Reddish ?Weeks in Treatment: 2 ?Electronic Signature(s) ?Signed: 02/24/2022 5:51:15 PM By: Lorrin Jackson ?Entered By: Lorrin Jackson on 02/24/2022 [id number] ?-------------------------------------------------------------------------------- ?Multi Wound Chart Details ?Patient Name: ?Date of Service: ?Quilling, Darren Bishop M R. 02/24/2022 8:30 A M ?Medical Record Number: [id number] ?Patient Account Number: [id number] ?Date of Birth/Sex: ?Treating RN: ?[birth year] (88 y.o. Darren [name] ?Primary Care Fernando Torry: Garret Reddish ?Other Clinician: ?Referring Keilan Nichol: ?Treating Jerime Arif/Extender: Kalman Shan ?Garret Reddish ?Weeks in Treatment: 2 ?Vital Signs ?Height(in): 70 ?Pulse(bpm): 69 ?Weight(lbs): 160 ?Blood Pressure(mmHg): 129/64 ?Body Mass Index(BMI): 23 ?Temperature(??F): 98.1 ?Respiratory Rate(breaths/min): 18 ?Photos: ?Right, Medial Forearm Right Upper Arm Right Wrist ?Wound Location: ?Trauma Trauma Trauma ?Wounding Event: ?Trauma, Other Trauma, Other Trauma,  Other ?Primary Etiology: ?Cataracts, Asthma, Hypertension, Cataracts, Asthma, Hypertension, Cataracts, Asthma, Hypertension, ?Comorbid History: ?Osteoarthritis Osteoarthritis Osteoarthritis ?01/15/2022 01/15/2022 01/15/2022 ?Date

## 2022-03-17 ENCOUNTER — Other Ambulatory Visit: Payer: Self-pay | Admitting: Neurology

## 2022-03-21 ENCOUNTER — Telehealth: Payer: Self-pay | Admitting: Family Medicine

## 2022-03-21 NOTE — Telephone Encounter (Signed)
Called and spoke with Darren [name] and labs reviewed. ?

## 2022-03-21 NOTE — Telephone Encounter (Signed)
Darren [name] with [facility] is needing VO. Please call [phone]. She also faxed over the paperwork to be signed. ?

## 2022-03-29 ENCOUNTER — Telehealth: Payer: Self-pay | Admitting: Family Medicine

## 2022-03-29 DIAGNOSIS — T17308A Unspecified foreign body in larynx causing other injury, initial encounter: Secondary | ICD-10-CM

## 2022-03-29 DIAGNOSIS — R1310 Dysphagia, unspecified: Secondary | ICD-10-CM

## 2022-03-29 NOTE — Telephone Encounter (Signed)
The note says it is from [facility] will be fine if they are able to do this-do they simply need a physician order?  If so you can refer under choking ?

## 2022-03-29 NOTE — Telephone Encounter (Signed)
Earle Gell, SLP ?States pt has had a couple of choking events recently. ? ?Recommending a Modified barium swallow study. ? ?Requesting a referral to be sent.  ?

## 2022-03-29 NOTE — Telephone Encounter (Signed)
Is this done at GI or do you order this test? ?

## 2022-03-30 NOTE — Telephone Encounter (Signed)
Can try dysphagia ?

## 2022-03-30 NOTE — Telephone Encounter (Signed)
Called and spoke with Darren [name] and she states they need an order for the Barium Swallow to be done at [facility] since they do not do these at [facility]. ?

## 2022-03-30 NOTE — Telephone Encounter (Signed)
I tried ordering this but it is not taking the "choking" diagnosis code. ?

## 2022-03-31 ENCOUNTER — Other Ambulatory Visit (HOSPITAL_COMMUNITY): Payer: Self-pay

## 2022-03-31 DIAGNOSIS — R059 Cough, unspecified: Secondary | ICD-10-CM

## 2022-03-31 DIAGNOSIS — R1310 Dysphagia, unspecified: Secondary | ICD-10-CM

## 2022-03-31 NOTE — Telephone Encounter (Signed)
It did not work with that either, can you look at the order for me please? ?

## 2022-03-31 NOTE — Telephone Encounter (Signed)
I entered 2 orders- says needs barium plus referral to speech- can you please have lisa look at this to make sure correct ?

## 2022-04-04 NOTE — Telephone Encounter (Signed)
Test have been done and orders correct.  ?

## 2022-04-07 ENCOUNTER — Ambulatory Visit (HOSPITAL_COMMUNITY)
Admission: RE | Admit: 2022-04-07 | Discharge: 2022-04-07 | Disposition: A | Discharge status: Home / Self Care | Payer: Medicare Other | Source: Ambulatory Visit | Attending: Family Medicine | Admitting: Family Medicine

## 2022-04-07 DIAGNOSIS — R1310 Dysphagia, unspecified: Secondary | ICD-10-CM

## 2022-04-07 DIAGNOSIS — R059 Cough, unspecified: Secondary | ICD-10-CM

## 2022-04-07 DIAGNOSIS — R1313 Dysphagia, pharyngeal phase: Secondary | ICD-10-CM | POA: Insufficient documentation

## 2022-04-07 DIAGNOSIS — R0989 Other specified symptoms and signs involving the circulatory and respiratory systems: Secondary | ICD-10-CM | POA: Insufficient documentation

## 2022-04-07 DIAGNOSIS — T17308A Unspecified foreign body in larynx causing other injury, initial encounter: Secondary | ICD-10-CM

## 2022-04-07 NOTE — Progress Notes (Signed)
Modified Barium Swallow Progress Note  Patient Details  Name: Darren [name] MRN: [id number] Date of Birth: 03-Jun-1934  Today's Date: 04/07/2022  Modified Barium Swallow completed.  Full report located under Chart Review in the Imaging Section.  Brief recommendations include the following:  Clinical Impression  Pt presents with pharyngeal dysphagia, including reduced base of tongue retraction, pharyngeal squeeze, and hyolaryngeal movement that leads to incomplete epiglottic inversion, laryngeal vestibule closure, and UES opening. Pt subsequently has residue in his valleculae and pyriform sinuses that increases as boluses become heavier. Solid foods in particular leave large amounts of residue, with one instance of trace aspiration after the swallow from residue in the valleculae. One instance of trace aspiration also occurred with a thin liquid wash. Both occurred silently (PAS 8). Chin tuck and head turns did not significantly impact function. Pt does do multiple subswallows spontaneously, and when cued to swallow "hard," this seem to reduce some of the pharyngeal residue. Once he had a cleared as much as he could, a small liquid wash was also helpful. Would continue to use moist, finely chopped foods and thin liquids with use of these strategies. Would also encourage ongoing SLP follow up.   Swallow Evaluation Recommendations       SLP Diet Recommendations: Dysphagia 3 (Mech soft) solids;Dysphagia 2 (Fine chop) solids;Thin liquid   Liquid Administration via: Cup   Medication Administration: Crushed with puree (as able)   Supervision: Patient able to self feed   Compensations: Slow rate;Small sips/bites;Multiple dry swallows after each bite/sip;Effortful swallow;Follow solids with liquid   Postural Changes: Seated upright at 90 degrees;Remain semi-upright after after feeds/meals (Comment)   Oral Care Recommendations: Oral care BID        Mahala Menghini., M.A. CCC-SLP Acute  Rehabilitation Services Office [phone]  Secure chat preferred  04/07/2022,1:37 PM

## 2022-04-13 ENCOUNTER — Other Ambulatory Visit: Payer: Self-pay | Admitting: Family Medicine

## 2022-05-25 ENCOUNTER — Other Ambulatory Visit: Payer: Self-pay | Admitting: Family Medicine

## 2022-06-10 ENCOUNTER — Encounter: Payer: Self-pay | Admitting: Family Medicine

## 2022-06-22 ENCOUNTER — Other Ambulatory Visit: Payer: Self-pay | Admitting: Family Medicine

## 2022-06-22 ENCOUNTER — Other Ambulatory Visit: Payer: Self-pay | Admitting: Neurology

## 2022-06-22 DIAGNOSIS — G20 Parkinson's disease: Secondary | ICD-10-CM

## 2022-06-30 ENCOUNTER — Other Ambulatory Visit: Payer: Self-pay | Admitting: Neurology

## 2022-07-01 ENCOUNTER — Other Ambulatory Visit: Payer: Self-pay

## 2022-07-01 DIAGNOSIS — G20 Parkinson's disease: Secondary | ICD-10-CM

## 2022-07-01 DIAGNOSIS — F02818 Dementia in other diseases classified elsewhere, unspecified severity, with other behavioral disturbance: Secondary | ICD-10-CM

## 2022-07-01 MED ORDER — QUETIAPINE FUMARATE 25 MG PO TABS: ORAL_TABLET | ORAL | 0 refills | Status: DC

## 2022-07-12 NOTE — Progress Notes (Unsigned)
Assessment/Plan:   1.  Parkinsons Disease, diagnosed 2018  -Increase carbidopa/levodopa 25/100, 2/2/1  -Continue carbidopa/levodopa 50/200 CR at bedtime  -wife has POA for healthcare and finances.  -Encouraged use of walker over cane.  2.  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  Last ultrasound was in March, 2022.  We will monitor this approximately yearly.  -On aspirin, 81 mg daily.  -On Lipitor, 40 mg daily.  LDL was at goal.  Primary care monitoring.  3.  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  He is not driving.  -[facility] - independent with wife for now but looking into more step up care  -Patient DNR  -On quetiapine, 25 mg at night.  Can use extra half tablet in the day if needed.  4.  Pharyngeal dysphagia  -Had MBE on Apr 07, 2022 with recommendations for mechanical soft with thin liquids.   Subjective:   Darren [name] was seen today in follow up for Parkinsons disease.  My previous records were reviewed prior to todays visit as well as outside records available to me.   Patient accompanied by wife and son (a Teacher, early years/pre) who supplements visit.  Last visit, we discussed his quetiapine and how to get him to take it (he was agreeable to taking it with ice cream).  They report today that he is taking the medication and sleeping better.  They are not using it in the day.  He is having some daytime hallucinations, but generally is redirectable.  Patient was in the emergency room at the end of February for a fall, with abrasions to his arm.  This did not require sutures.  Emergency room records do not indicate that there was any loss of consciousness or alteration in consciousness.  Just reported that he tripped.  His son emailed me the following day stating that the wife suspected that there was some type of passing out spell.  Wife states that she does not think  that the patient tripped, but rather that the patient just went down and she suspected he had a brief moment of loss of consciousness.  I did review his CT and that was nonacute.  Discussed in his email back that passing out spells can be caused/contributed to by blood pressure medications, but wanted him to follow-up with his primary care for that, since they were the one prescribing the medication.  Meds have not been changed, but patient and wife report that blood pressure really has been running in the 140s and 150s systolic.  It was 118/60 in primary care office today.  Patient did have modified barium swallow done since last visit, ordered by primary care.  This was done in May, 2023.  Mechanical soft with thin liquids recommended.  Wife admits he is having swallowing trouble, but she is modifying the diet.  Current prescribed movement disorder medications: Carbidopa/levodopa 25/100, 2/1/1  Carbidopa/levodopa 50/200 CR at bedtime  Quetiapine, 25 mg, half tablet in the daytime if needed, 1 tablet at nighttime   PREVIOUS MEDICATIONS: propranolol  ALLERGIES:  No Known Allergies  CURRENT MEDICATIONS:  Outpatient Encounter Medications as of 07/14/2022  Medication Sig   albuterol (VENTOLIN HFA) 108 (90 Base) MCG/ACT inhaler INHALE 2 PUFFS INTO THE LUNGS EVERY 6 HOURS AS NEEDED FOR WHEEZING OR SHORTNESS OF BREATH   amLODipine-benazepril (LOTREL) 10-20 MG capsule TAKE 1 CAPSULE BY MOUTH EVERY DAY  ASPIRIN LOW DOSE 81 MG EC tablet TAKE 1 TABLET BY MOUTH EVERY DAY   atorvastatin (LIPITOR) 40 MG tablet TAKE 1 TABLET BY MOUTH EVERY DAY   carbidopa-levodopa (SINEMET CR) 50-200 MG tablet TAKE 1 TABLET BY MOUTH AT BEDTIME   carbidopa-levodopa (SINEMET IR) 25-100 MG tablet TAKE 2 TABLETS BY MOUTH EVERY MORNING and TAKE 1 TABLET BY MOUTH AT NOON and TAKE 1 TABLET AT 4PM   Cyanocobalamin 1000 MCG CAPS Take 1 capsule by mouth daily.   D3 SUPER STRENGTH 50 MCG (2000 UT) CAPS TAKE 2 CAPSULES BY MOUTH EVERY  DAY   famotidine (PEPCID) 20 MG tablet Take 1 tablet (20 mg total) by mouth 2 (two) times daily.   fluticasone (FLONASE) 50 MCG/ACT nasal spray USE 2 SPRAYS in each nostril 2 TIMES DAILY   fluticasone-salmeterol (ADVAIR) 100-50 MCG/ACT AEPB Inhale 1 puff into the lungs 2 (two) times daily.   QUEtiapine (SEROQUEL) 25 MG tablet TAKE 1 TABLET BY MOUTH AT BEDTIME. MAY TAKE 1/2 TAB DURING THE DAY IF NEEDED   [DISCONTINUED] Cholecalciferol (VITAMIN D3 PO) Take 2 capsules by mouth daily.   [DISCONTINUED] omeprazole (PRILOSEC) 20 MG capsule TAKE 1 CAPSULE BY MOUTH EVERY DAY   [DISCONTINUED] QUEtiapine (SEROQUEL) 25 MG tablet TAKE 1 TABLET BY MOUTH AT BEDTIME. MAY TAKE 1/2 TAB DURING THE DAY IF NEEDED   No facility-administered encounter medications on file as of 07/14/2022.    Objective:   PHYSICAL EXAMINATION:    VITALS:   Vitals:   07/14/22 1526  BP: (!) 144/66  Pulse: 70  SpO2: 97%  Weight: 153 lb (69.4 kg)  Height: 5\' 10"  (1.778 m)   Wt Readings from Last 3 Encounters:  07/14/22 153 lb (69.4 kg)  07/14/22 152 lb (68.9 kg)  01/25/22 163 lb (73.9 kg)       GEN:  The patient appears stated age and is in NAD. HEENT:  Normocephalic, atraumatic.  The mucous membranes are moist.   Neurological examination:  Orientation: The patient is alert and oriented to person Cranial nerves: There is good facial symmetry with facial hypomimia. The speech is fluent and clear. Soft palate rises symmetrically and there is no tongue deviation. Hearing is intact to conversational tone. Sensation: Sensation is intact to light touch throughout Motor: Strength is at least antigravity x4.  Movement examination: Tone: There is mild to mod increased tone in the RUE Abnormal movements: none today Coordination:  There is only apraxia bilaterally and slowness bilaterally with all RAMS Gait and Station: The patient pushes off to arise.  The patients stride length is decreased.  Ambulates with cane and is  unstable.  I have reviewed and interpreted the following labs independently    Chemistry      Component Value Date/Time   NA 143 01/13/2022 1055   K 4.4 01/13/2022 1055   CL 106 01/13/2022 1055   CO2 32 01/13/2022 1055   BUN 24 (H) 01/13/2022 1055   CREATININE 1.27 01/13/2022 1055   CREATININE 1.59 (H) 11/27/2020 1514      Component Value Date/Time   CALCIUM 9.5 01/13/2022 1055   ALKPHOS 67 01/13/2022 1055   AST 18 01/13/2022 1055   ALT 5 01/13/2022 1055   BILITOT 0.8 01/13/2022 1055       Lab Results  Component Value Date   WBC 5.9 01/13/2022   HGB 12.6 (L) 01/13/2022   HCT 37.3 (L) 01/13/2022   MCV 93.2 01/13/2022   PLT 211.0 01/13/2022    No results found  for: "TSH"  Total time spent on today's visit was 25 minutes, including both face-to-face time and nonface-to-face time.  Time included that spent on review of records (prior notes available to me/labs/imaging if pertinent), discussing treatment and goals, answering patient's questions and coordinating care.    Cc:  [name], MD

## 2022-07-14 ENCOUNTER — Ambulatory Visit (INDEPENDENT_AMBULATORY_CARE_PROVIDER_SITE_OTHER): Payer: Medicare Other | Admitting: Neurology

## 2022-07-14 ENCOUNTER — Encounter: Payer: Self-pay | Admitting: Family Medicine

## 2022-07-14 ENCOUNTER — Ambulatory Visit (INDEPENDENT_AMBULATORY_CARE_PROVIDER_SITE_OTHER): Payer: Medicare Other | Admitting: Family Medicine

## 2022-07-14 ENCOUNTER — Encounter: Payer: Self-pay | Admitting: Neurology

## 2022-07-14 VITALS — BP 144/66 | HR 70 | Ht 70.0 in | Wt 153.0 lb

## 2022-07-14 VITALS — BP 118/60 | HR 69 | Temp 97.7°F | Ht 70.0 in | Wt 152.0 lb

## 2022-07-14 DIAGNOSIS — F02818 Dementia in other diseases classified elsewhere, unspecified severity, with other behavioral disturbance: Secondary | ICD-10-CM

## 2022-07-14 DIAGNOSIS — E785 Hyperlipidemia, unspecified: Secondary | ICD-10-CM

## 2022-07-14 DIAGNOSIS — G20 Parkinson's disease: Secondary | ICD-10-CM

## 2022-07-14 DIAGNOSIS — I10 Essential (primary) hypertension: Secondary | ICD-10-CM | POA: Diagnosis not present

## 2022-07-14 LAB — CBC WITH DIFFERENTIAL/PLATELET
Basophils Absolute: 0.1 10*3/uL (ref 0.0–0.1)
Basophils Relative: 0.8 % (ref 0.0–3.0)
Eosinophils Absolute: 0.1 10*3/uL (ref 0.0–0.7)
Eosinophils Relative: 1.8 % (ref 0.0–5.0)
HCT: 39.4 % (ref 39.0–52.0)
Hemoglobin: 13.2 g/dL (ref 13.0–17.0)
Lymphocytes Relative: 12.7 % (ref 12.0–46.0)
Lymphs Abs: 0.8 10*3/uL (ref 0.7–4.0)
MCHC: 33.6 g/dL (ref 30.0–36.0)
MCV: 93.5 fl (ref 78.0–100.0)
Monocytes Absolute: 0.6 10*3/uL (ref 0.1–1.0)
Monocytes Relative: 8.5 % (ref 3.0–12.0)
Neutro Abs: 5 10*3/uL (ref 1.4–7.7)
Neutrophils Relative %: 76.2 % (ref 43.0–77.0)
Platelets: 233 10*3/uL (ref 150.0–400.0)
RBC: 4.21 Mil/uL — ABNORMAL LOW (ref 4.22–5.81)
RDW: 14.7 % (ref 11.5–15.5)
WBC: 6.6 10*3/uL (ref 4.0–10.5)

## 2022-07-14 LAB — COMPREHENSIVE METABOLIC PANEL
ALT: 3 U/L (ref 0–53)
AST: 16 U/L (ref 0–37)
Albumin: 4.4 g/dL (ref 3.5–5.2)
Alkaline Phosphatase: 62 U/L (ref 39–117)
BUN: 34 mg/dL — ABNORMAL HIGH (ref 6–23)
CO2: 25 mEq/L (ref 19–32)
Calcium: 9.8 mg/dL (ref 8.4–10.5)
Chloride: 107 mEq/L (ref 96–112)
Creatinine, Ser: 1.35 mg/dL (ref 0.40–1.50)
GFR: 47.82 mL/min — ABNORMAL LOW (ref >60.00)
Glucose, Bld: 94 mg/dL (ref 70–99)
Potassium: 4.6 mEq/L (ref 3.5–5.1)
Sodium: 143 mEq/L (ref 135–145)
Total Bilirubin: 0.6 mg/dL (ref 0.2–1.2)
Total Protein: 7.3 g/dL (ref 6.0–8.3)

## 2022-07-14 LAB — TSH: TSH: 1.63 u[IU]/mL (ref 0.35–5.50)

## 2022-07-14 MED ORDER — CARBIDOPA-LEVODOPA 25-100 MG PO TABS: ORAL_TABLET | ORAL | 5 refills | Status: DC

## 2022-07-14 MED ORDER — CYANOCOBALAMIN 1000 MCG PO CAPS: 1.0000 | ORAL_CAPSULE | Freq: Every day | ORAL | 3 refills | Status: AC

## 2022-07-14 MED ORDER — FAMOTIDINE 20 MG PO TABS: 20.0000 mg | ORAL_TABLET | Freq: Two times a day (BID) | ORAL | 3 refills | Status: DC

## 2022-07-14 NOTE — Patient Instructions (Addendum)
Flu shot- we should have these available within a month or two but please let us know if you get at pharmacy (high dose recommended) -also recommend updated covid shot in October when new one released   blood pressure has been variable on in office readings but lower today . I would prefer for blood pressure to be between 110-135/hopefully over 65- please monitor at home and update me in a week or two- for now continue current meds  Please stop by lab before you go If you have mychart- we will send your results within 3 business days of [place] receiving them.  If you do not have mychart- we will call you about results within 5 business days of [place] receiving them.  *please also note that you will see labs on mychart as soon as they post. I will later go in and write notes on them- will say "notes from Dr. Durene Cal"   Recommended follow up: Return in about 6 months (around 01/14/2023) for followup or sooner if needed.Schedule b4 you leave.

## 2022-07-14 NOTE — Progress Notes (Signed)
Phone [phone] In person visit   Subjective:   Darren [name] is a 88 y.o. year old very pleasant male patient who presents for/with See problem oriented charting Chief Complaint  Patient presents with   Follow-up   Hypertension   Hyperlipidemia   Past Medical History-  Patient Active Problem List   Diagnosis Date Noted   Parkinson disease (HCC) 11/27/2020    Priority: High   Essential hypertension 07/06/2021    Priority: Medium    Hyperlipidemia 11/27/2020    Priority: Medium    Gastroesophageal reflux disease without esophagitis 11/27/2020    Priority: Medium    History of CVA (cerebrovascular accident) 11/27/2020    Priority: Low   Medications- reviewed and updated Current Outpatient Medications  Medication Sig Dispense Refill   albuterol (VENTOLIN HFA) 108 (90 Base) MCG/ACT inhaler INHALE 2 PUFFS INTO THE LUNGS EVERY 6 HOURS AS NEEDED FOR WHEEZING OR SHORTNESS OF BREATH 8.5 g 2   amLODipine-benazepril (LOTREL) 10-20 MG capsule TAKE 1 CAPSULE BY MOUTH EVERY DAY 90 capsule 1   ASPIRIN LOW DOSE 81 MG EC tablet TAKE 1 TABLET BY MOUTH EVERY DAY 90 tablet 3   atorvastatin (LIPITOR) 40 MG tablet TAKE 1 TABLET BY MOUTH EVERY DAY 90 tablet 4   carbidopa-levodopa (SINEMET CR) 50-200 MG tablet TAKE 1 TABLET BY MOUTH AT BEDTIME 90 tablet 0   carbidopa-levodopa (SINEMET IR) 25-100 MG tablet TAKE 2 TABLETS BY MOUTH EVERY MORNING and TAKE 1 TABLET BY MOUTH AT NOON and TAKE 1 TABLET AT 4PM 360 tablet 0   Cyanocobalamin 1000 MCG CAPS Take 1 capsule by mouth daily. 90 capsule 3   D3 SUPER STRENGTH 50 MCG (2000 UT) CAPS TAKE 2 CAPSULES BY MOUTH EVERY DAY 180 capsule 3   famotidine (PEPCID) 20 MG tablet Take 1 tablet (20 mg total) by mouth 2 (two) times daily. 180 tablet 3   fluticasone (FLONASE) 50 MCG/ACT nasal spray USE 2 SPRAYS in each nostril 2 TIMES DAILY 16 g 0   fluticasone-salmeterol (ADVAIR) 100-50 MCG/ACT AEPB Inhale 1 puff into the lungs 2 (two) times daily. 1 each 11    QUEtiapine (SEROQUEL) 25 MG tablet TAKE 1 TABLET BY MOUTH AT BEDTIME. MAY TAKE 1/2 TAB DURING THE DAY IF NEEDED 135 tablet 0   Cholecalciferol (VITAMIN D3 PO) Take 2 capsules by mouth daily.     QUEtiapine (SEROQUEL) 25 MG tablet TAKE 1 TABLET BY MOUTH AT BEDTIME. MAY TAKE 1/2 TAB DURING THE DAY IF NEEDED 135 tablet 0   No current facility-administered medications for this visit.     Objective:  BP 118/60   Pulse 69   Temp 97.7 F (36.5 C)   Ht 5\' 10"  (1.778 m)   Wt 152 lb (68.9 kg)   SpO2 96%   BMI 21.81 kg/m  Gen: NAD, resting comfortably Cerumen impaction team will attempt irrigation CV: RRR no murmurs rubs or gallops Lungs: CTAB no crackles, wheeze, rhonchi Abdomen: soft/nontender/nondistended/normal bowel sounds. No rebound or guarding.  Ext: no edema Skin: warm, dry Neuro: slowe dresponses- looks to family for many answers    Assessment and Plan   #cerumen impaction- team to irrigate  #Unintentional weight loss- down 11 lbs from last visit- discussed unintentional weight loss workup but would likely not want to do cancer treatment if detected- in end opted only for cbc, cmp, tsh but if worsening anemia may repeat stool cards. still eating 3 meals a day plus snacks. Feels could be muscle mass loss - feeling  weaker Wt Readings from Last 3 Encounters:  07/14/22 152 lb (68.9 kg)  01/25/22 163 lb (73.9 kg)  01/13/22 163 lb 9.6 oz (74.2 kg)   # Parkinson's disease S:diagnosed  At least 2019. On carbidopa levodopa  CR 50-200mg  at bedtime.   Also now on Sineemet instant release 25-100 mg 2 tablets in the a.m., 1 tablet at noon and 1 tablet at 4 PM - referral with Dr. Arbutus Leas at neurology-she placed him on Seroquel 25 mg - 12.5 mg before bed with significant improvement in sleep  Has been using home health services at [facility]- we filled out forms in late July . Working with PT but finished with [facility]. Finds PT helpful A/P: overall stable- continue current meds and neurology follow  up   #hypertension S: medication: amlodipine-benazepril 10-20 mg in the morning.  Some lightheadedness BP Readings from Last 3 Encounters:  07/14/22 118/60  01/25/22 130/62  01/15/22 (!) 155/73  Home readings #s: not checking A/P: controlled but blood pressure has been variable on in office readings but lower today . I would prefer for blood pressure to be between 110-135/hopefully over 65- please monitor at home and update me in a week or two- for now continue current meds  # history of CVA-presented with- left arm weakness and seen neurology before moving to [facility] in August 2021.  No residual weakness reported #hyperlipidemia S: Medication:atorvastatin 40 mg daily with LDL typically under 70, aspirin 81 mg started after stroke Lab Results  Component Value Date   CHOL 137 01/13/2022   HDL 56.30 01/13/2022   LDLCALC 57 01/13/2022   TRIG 118.0 01/13/2022   CHOLHDL 2 01/13/2022   A/P:  lipids excellent control last check- continue current meds. Continue aspirin stroke prevention   # GERD S:Medication: omeprazole 20 mg - we discussed the option of trying Pepcid in the long-run and asked patient to consider this for 06/2021 visit - has not tried yet- would be easier with pill pakcs  B12 levels related to PPI use: Have been low normal January 2022 and again 2023- recommended restarted- will send in as rx Lab Results  Component Value Date   VITAMINB12 235 01/13/2022  -does have dysphagia- swallow study 04/07/22 moderate aspiration risk- dietary modifications A/P: reflux stable- still could consider short term trial of pepcid over the counter twice daily as an alternate to omeprazole- will send this in  # Asthma S: Maintenance Medication: Advair 100-50 mg twice daily in past not needing regularly now- not even needing albuterol as below As needed medication: Albuterol only at this point. Patient is using this very rarely  A/P: Controlled. Continue current medications.   Recommended follow  up: Return in about 6 months (around 01/14/2023) for followup or sooner if needed.Schedule b4 you leave. Future Appointments  Date Time Provider Department Center  07/14/2022  3:30 PM Tat, Octaviano Batty, DO [facility] None  11/22/2022 10:15 AM [facility] HEALTH COACH [facility]   Lab/Order associations:   ICD-10-CM   1. Essential hypertension  I10 CBC with Differential/Platelet    Comprehensive metabolic panel    2. Hyperlipidemia, unspecified hyperlipidemia type  E78.5 TSH      Meds ordered this encounter  Medications   Cyanocobalamin 1000 MCG CAPS    Sig: Take 1 capsule by mouth daily.    Dispense:  90 capsule    Refill:  3   famotidine (PEPCID) 20 MG tablet    Sig: Take 1 tablet (20 mg total) by mouth 2 (two) times daily.  Dispense:  180 tablet    Refill:  3    Return precautions advised.  Tana Conch, MD

## 2022-07-14 NOTE — Patient Instructions (Addendum)
Increase carbidopa/levodopa, 2 at 7, 2 at 11am, 1 at 4pm Continue carbidopa/levodopa 50/200 CR at bedtime  Local and Online Resources for Power over Parkinson's Group August 2023  LOCAL [place] PARKINSON'S GROUPS  Power over Parkinson's Group:   Power Over Parkinson's Patient Education Group will be Wednesday, August 9th-*Hybrid meting*- in person at [facility] location and via [facility] at 2:00 pm.   Upcoming Power over [employer] Meetings:  2nd Wednesdays of the month at 2 pm:  August 9th, September 13th, October 11th Contact Amy Marriott at [email or web address] if interested in participating in this group Parkinson's Care Partners Group:    3rd Mondays, Contact Misty Paladino Atypical Parkinsonian Patient Group:   4th Wednesdays, Contact Misty Paladino If you are interested in participating in these groups with Misty, please contact her directly for how to join those meetings.  Her contact information is [email or web address].    LOCAL EVENTS AND NEW OFFERINGS Parkinson's T-shirts for sale!  Designed by a local group member, with funds going to [employer]. X-Large sizes available.  Contact Misty to purchase  New PWR! Moves [employer] Instructor-Led Class offering at [employer]!  Wednesdays 1-2 pm, starting April 12th.   Contact Kathlene November Sabin at [employer], [email or web address]   ONLINE EDUCATION AND SUPPORT Parkinson Foundation:  www.parkinson.org PD Health at Home continues:  Mindfulness Mondays, Wellness Wednesdays, Fitness Fridays  Upcoming Education:   Care Partners:  Why they Matter and why their Needs Matter.  Wednesday, August 9th 1-2 pm Invisible Symptoms:  NonMotor Symptoms.  Wednesday, August 16th 1-2 pm Expert Briefing:   Parkinson's Disease and the Bladder.  Wednesday, September 13th 1:00-2:00 pm Register for expert briefings (webinars) at  September 15 Please check out their website to sign up for emails and see their full online offerings   [employer] Foundation:  www.[email or web address]  Third Thursday Webinars:  On the third Thursday of every month at 12 p.m. ET, join our free live webinars to learn about various aspects of living with Parkinson's disease and our work to speed medical breakthroughs. Upcoming Webinar: Too Much or Not Enough:  Dyskinesia and "off" time in Parkinson's (Replay).  Thursday, August 17th at 12 noon. Check out additional information on their website to see their full online offerings  [facility]:  [email or web address] Upcoming Webinar:   Stay tuned Webinar Series:  Living with Parkinson's Meetup.   Third Thursdays each month, 3 pm Care Partner Monthly Meetup.  With 04-22-1978 [place].  First Tuesday of each month, 2 pm Check out additional information to Live Well Today on their website  Parkinson and Movement Disorders (PMD) Alliance:  www.pmdalliance.org NeuroLife Online:  Online Education Events Sign up for emails, which are sent weekly to give you updates on programming and online offerings  Parkinson's Association of the [place]:  www.[email or web address] Information on online support groups, education events, and online exercises including Yoga, Parkinson's exercises and more-LOTS of information on links to PD resources and online events Virtual Support Group through Parkinson's Association of the [place]; next one is scheduled for Wednesday, September 6th at 2 pm. (These are typically scheduled for the 1st Wednesday of the month at 2 pm).  Visit website for details. Save the date for "Caring for Parkinson's-Caring for You", 9th Annual Symposium.  In-person event in [place].  September 9th.  More info on registration to come. MOVEMENT AND EXERCISE OPPORTUNITIES PWR! Moves Classes at  [facility] Exercise Room.  Wednesdays 10 and 11 am.   Contact Amy Marriott, PT [email or web address]  if interested. NEW PWR! Moves Class offering at [employer].  Wednesdays 1-2 pm, starting April 12th.  Contact Aldona Lento at [place], [email or web address] Here is a link to the PWR!Moves classes on Zoom from [place] - Daily Mon-Sat at 10:00. Via Zoom, FREE and open to all.  There is also a link below via Facebook if you use that platform.  CopCameras.is [email or web address]  Parkinson's Wellness Recovery (PWR! Moves)  www.[email or web address] Info on the PWR! Virtual Experience:  You will have access to our expertise through self-assessment, guided plans that start with the PD-specific fundamentals, educational content, tips, Q&A with an expert, and a growing Engineering geologist of PD-specific pre-recorded and live exercise classes of varying types and intensity - both physical and cognitive! If that is not enough, we offer 1:1 wellness consultations (in-person or virtual) to personalize your PWR! Dance movement psychotherapist.  Parkinson [employer] Fridays:  As part of the PD Health @ Home program, this free video series focuses each week on one aspect of fitness designed to support people living with Parkinson's.  These weekly videos highlight the Parkinson Foundation recent fitness guidelines for people with Parkinson's disease. MenusLocal.com.br Dance for PD website is offering free, live-stream classes throughout the week, as well as links to [employer] of classes:  [email or web address]/ Virtual dance and Pilates for Parkinson's classes: Click on the Community Tab> Parkinson's Movement Initiative Tab.  To register for classes and  for more information, visit www.[email or web address].co.za and click the "community" tab.  [employer] Parkinson's Cycling Classes  [employer]:  Thursdays @ Noon-Live classes at [employer] ([employer] at [email or web address] or ([phone]) [employer]: Virtual Classes Mondays and Thursdays Fawn Kirk classes Tuesday, Wednesday and Thursday (contact [place] at [email or web address]  or ([phone]) [facility] Boxing Varied levels of classes are offered Tuesdays and Thursdays at [facility].  Stretching with Byrd Hesselbach weekly class is also offered for people with Parkinson's To observe a class or for more information, call [phone] or email Patricia Nettle at [email or web address] ADDITIONAL SUPPORT AND RESOURCES Well[employer]:Online Caregiver Education Opportunities:  www.well-springsolutions.org/caregiver-education/caregiver-support-group.  You may also contact Loleta Chance at [email or web address] or [phone].    Well-Spring Navigator:  [phone] program, a free service to help individuals and families through the journey of determining care for older adults.  The "Navigator" is a [employer], Child psychotherapist, who will speak with a prospective client and/or loved ones to provide an assessment of the situation and a set of recommendations for a personalized care plan -- all free of charge, and whether [facility] [employer] offers the needed service or not. If the need is not a service we provide, we are well-connected with reputable programs in town that we can refer you to.  www.well-springsolutions.org or to speak with the Navigator, call [phone].

## 2022-08-16 ENCOUNTER — Other Ambulatory Visit: Payer: Self-pay

## 2022-08-16 ENCOUNTER — Other Ambulatory Visit: Payer: Self-pay | Admitting: Family Medicine

## 2022-08-16 ENCOUNTER — Encounter: Payer: Self-pay | Admitting: Family Medicine

## 2022-08-16 MED ORDER — OMEPRAZOLE 20 MG PO CPDR: DELAYED_RELEASE_CAPSULE | ORAL | 3 refills | Status: DC

## 2022-08-25 ENCOUNTER — Other Ambulatory Visit: Payer: Self-pay | Admitting: Neurology

## 2022-08-25 DIAGNOSIS — G20A1 Parkinson's disease without dyskinesia, without mention of fluctuations: Secondary | ICD-10-CM

## 2022-09-29 ENCOUNTER — Other Ambulatory Visit: Payer: Self-pay | Admitting: Family Medicine

## 2022-10-18 ENCOUNTER — Other Ambulatory Visit: Payer: Self-pay | Admitting: Family Medicine

## 2022-11-01 NOTE — Progress Notes (Unsigned)
Virtual Visit Via Video       Consent was obtained for video visit:  {yes no:[id number]} Answered questions that patient had about telehealth interaction:  {yes no:314532} I discussed the limitations, risks, security and privacy concerns of performing an evaluation and management service by telemedicine. I also discussed with the patient that there may be a patient responsible charge related to this service. The patient expressed understanding and agreed to proceed.  Pt location: Home Physician Location: office Name of referring provider:  [name], MD I connected with Darren [place] at patients initiation/request on 11/03/2022 at  8:45 AM EST by video enabled telemedicine application and verified that I am speaking with the correct person using two identifiers. Pt MRN:  [id number] Pt DOB:  11-10-1934 Video Participants:  Darren [name];  ***  Assessment/Plan:   1.  Parkinsons Disease, diagnosed 2018  -*** carbidopa/levodopa 25/100, 2/2/1  -Continue carbidopa/levodopa 50/200 CR at bedtime  -wife has POA for healthcare and finances.  -Encouraged use of walker over cane.  2.  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  Last ultrasound was in March, 2022.  We will monitor this approximately yearly.  -On aspirin, 81 mg daily.  -On Lipitor, 40 mg daily.  LDL was at goal.  Primary care monitoring.  3.  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  He is not driving.  -living at [facility] - independent with wife for now but looking into more step up care  -Patient DNR  -On quetiapine, 25 mg at night.  Can use extra half tablet in the day if needed.  4.  Pharyngeal dysphagia  -Had MBE on Apr 07, 2022 with recommendations for mechanical soft with thin liquids.   Subjective:   Darren [name] was seen today in follow up for Parkinsons disease.  My previous records were reviewed  prior to todays visit as well as outside records available to me.  No falls since last visit.  No lightheadedness or near syncope.  Current prescribed movement disorder medications: Carbidopa/levodopa 25/100, 2/2/1  Carbidopa/levodopa 50/200 CR at bedtime  Quetiapine, 25 mg, half tablet in the daytime if needed, 1 tablet at nighttime   PREVIOUS MEDICATIONS: propranolol  ALLERGIES:  No Known Allergies  CURRENT MEDICATIONS:  Outpatient Encounter Medications as of 11/03/2022  Medication Sig   albuterol (VENTOLIN HFA) 108 (90 Base) MCG/ACT inhaler INHALE 2 PUFFS INTO THE LUNGS EVERY 6 HOURS AS NEEDED FOR WHEEZING OR SHORTNESS OF BREATH   amLODipine-benazepril (LOTREL) 10-20 MG capsule TAKE 1 CAPSULE BY MOUTH EVERY DAY   ASPIRIN LOW DOSE 81 MG tablet TAKE 1 TABLET BY MOUTH EVERY DAY   atorvastatin (LIPITOR) 40 MG tablet TAKE 1 TABLET BY MOUTH EVERY DAY   carbidopa-levodopa (SINEMET CR) 50-200 MG tablet TAKE 1 TABLET BY MOUTH AT BEDTIME   carbidopa-levodopa (SINEMET IR) 25-100 MG tablet 2 at 7, 2 at 11am, 1 at 4pm   D3 SUPER STRENGTH 50 MCG (2000 UT) CAPS TAKE 2 CAPSULES BY MOUTH EVERY DAY   famotidine (PEPCID) 20 MG tablet Take 1 tablet (20 mg total) by mouth 2 (two) times daily.   fluticasone (FLONASE) 50 MCG/ACT nasal spray USE 2 SPRAYS in each nostril 2 TIMES DAILY   fluticasone-salmeterol (ADVAIR) 100-50 MCG/ACT AEPB Inhale 1 puff into the lungs 2 (two) times daily.   omeprazole (PRILOSEC) 20 MG capsule TAKE 1 CAPSULE BY MOUTH EVERY DAY  QUEtiapine (SEROQUEL) 25 MG tablet TAKE 1 TABLET BY MOUTH AT BEDTIME. MAY TAKE 1/2 TAB DURING THE DAY IF NEEDED   No facility-administered encounter medications on file as of 11/03/2022.    Objective:   PHYSICAL EXAMINATION:    VITALS:   There were no vitals filed for this visit.  Wt Readings from Last 3 Encounters:  07/14/22 153 lb (69.4 kg)  07/14/22 152 lb (68.9 kg)  01/25/22 163 lb (73.9 kg)       GEN:  The patient appears stated  age and is in NAD. HEENT:  Normocephalic, atraumatic.  The mucous membranes are moist.   Neurological examination:  Orientation: The patient is alert and oriented to person Cranial nerves: There is good facial symmetry with facial hypomimia. The speech is fluent and clear. Hearing is intact to conversational tone. Sensation: Unable Motor: Strength is at least antigravity x4.  Movement examination: Tone: Unable Abnormal movements: none today Coordination:  There is only apraxia bilaterally and slowness bilaterally with all RAMS Gait and Station: The patient pushes off to arise.  The patients stride length is decreased.  Ambulates with cane and is unstable.  I have reviewed and interpreted the following labs independently    Chemistry      Component Value Date/Time   NA 143 07/14/2022 0925   K 4.6 07/14/2022 0925   CL 107 07/14/2022 0925   CO2 25 07/14/2022 0925   BUN 34 (H) 07/14/2022 0925   CREATININE 1.35 07/14/2022 0925   CREATININE 1.59 (H) 11/27/2020 1514      Component Value Date/Time   CALCIUM 9.8 07/14/2022 0925   ALKPHOS 62 07/14/2022 0925   AST 16 07/14/2022 0925   ALT 3 07/14/2022 0925   BILITOT 0.6 07/14/2022 0925       Lab Results  Component Value Date   WBC 6.6 07/14/2022   HGB 13.2 07/14/2022   HCT 39.4 07/14/2022   MCV 93.5 07/14/2022   PLT 233.0 07/14/2022    Lab Results  Component Value Date   TSH 1.63 07/14/2022    Follow up Instructions      -I discussed the assessment and treatment plan with the patient. The patient was provided an opportunity to ask questions and all were answered. The patient agreed with the plan and demonstrated an understanding of the instructions.   The patient was advised to call back or seek an in-person evaluation if the symptoms worsen or if the condition fails to improve as anticipated.    Total time spent on today's visit was ***minutes, including both face-to-face time and nonface-to-face time.  Time  included that spent on review of records (prior notes available to me/labs/imaging if pertinent), discussing treatment and goals, answering patient's questions and coordinating care.   Alonza Bogus, DO     Cc:  Marin Olp, MD

## 2022-11-03 ENCOUNTER — Other Ambulatory Visit: Payer: Self-pay

## 2022-11-03 ENCOUNTER — Telehealth (INDEPENDENT_AMBULATORY_CARE_PROVIDER_SITE_OTHER): Payer: Medicare Other | Admitting: Neurology

## 2022-11-03 DIAGNOSIS — I6521 Occlusion and stenosis of right carotid artery: Secondary | ICD-10-CM

## 2022-11-03 DIAGNOSIS — G20A1 Parkinson's disease without dyskinesia, without mention of fluctuations: Secondary | ICD-10-CM

## 2022-11-15 ENCOUNTER — Other Ambulatory Visit: Payer: Self-pay | Admitting: Neurology

## 2022-11-15 DIAGNOSIS — G20A1 Parkinson's disease without dyskinesia, without mention of fluctuations: Secondary | ICD-10-CM

## 2022-11-22 ENCOUNTER — Ambulatory Visit: Payer: Medicare Other

## 2022-12-01 ENCOUNTER — Ambulatory Visit
Admission: RE | Admit: 2022-12-01 | Discharge: 2022-12-01 | Disposition: A | Discharge status: Home / Self Care | Payer: Medicare Other | Source: Ambulatory Visit | Attending: Neurology | Admitting: Neurology

## 2022-12-01 ENCOUNTER — Telehealth: Payer: Self-pay | Admitting: Neurology

## 2022-12-01 DIAGNOSIS — I6521 Occlusion and stenosis of right carotid artery: Secondary | ICD-10-CM

## 2022-12-01 NOTE — Telephone Encounter (Signed)
Put him in next Friday to go over carotid u/s results.  I think that they prefer video but either way is fine.  I think that their son has to help them connect to video

## 2022-12-02 ENCOUNTER — Other Ambulatory Visit: Payer: Self-pay

## 2022-12-02 DIAGNOSIS — I6521 Occlusion and stenosis of right carotid artery: Secondary | ICD-10-CM

## 2022-12-02 NOTE — Telephone Encounter (Signed)
Spoke to patients son and he is speaking to his mom and dad and seeing if they want to get the surgery. He wanted me to go ahead and put in the referral and I sent in the internal to [facility]. I did tell son the importance of the surgery and that the 70-99 percent blockage that has been shown

## 2022-12-02 NOTE — Telephone Encounter (Signed)
Spoke with patient son and they would need a Thursday appt  spoke with Dr Tat and she wants Vikki Ports to call patient son

## 2022-12-07 ENCOUNTER — Other Ambulatory Visit: Payer: Self-pay | Admitting: Neurology

## 2022-12-07 DIAGNOSIS — G20A1 Parkinson's disease without dyskinesia, without mention of fluctuations: Secondary | ICD-10-CM

## 2022-12-08 ENCOUNTER — Encounter: Payer: Self-pay | Admitting: Vascular Surgery

## 2022-12-08 ENCOUNTER — Ambulatory Visit (INDEPENDENT_AMBULATORY_CARE_PROVIDER_SITE_OTHER): Payer: Medicare Other | Admitting: Vascular Surgery

## 2022-12-08 VITALS — BP 125/63 | HR 20 | Temp 97.9°F | Resp 20 | Ht 70.0 in | Wt 159.0 lb

## 2022-12-08 DIAGNOSIS — I6521 Occlusion and stenosis of right carotid artery: Secondary | ICD-10-CM | POA: Diagnosis not present

## 2022-12-08 NOTE — Progress Notes (Signed)
ASSESSMENT & PLAN   ASYMPTOMATIC 60 TO 79% RIGHT CAROTID STENOSIS: Based on the velocity criteria the right carotid stenosis is less than 80%.  I have explained that in a normal risk patient we would not consider carotid endarterectomy in an asymptomatic patient unless the stenosis progressed to greater than 80%.  Given Darren [name] I think he would be at higher risk for surgery.  I think I would likely only consider surgery if the stenosis progressed significantly or if became symptomatic.  I have ordered a follow-up carotid duplex scan in 6 months and I will see him back at that time.  He is on aspirin and is on a statin.  He is not a smoker.  I have encouraged him to stay as active as possible.  We have reviewed the potential symptoms of cerebrovascular disease and he will call if he develops any new symptoms.  REASON FOR CONSULT:    Right carotid stenosis.  HPI:   Darren [name] is a 89 y.o. male who had a carotid duplex scan on 12/01/2022 which showed evidence of a greater than 70% right carotid stenosis.  He was sent for vascular consultation.  Of note this patient had a cerebral infarct in 2021 associated with right-sided weakness.  At that time he had a less than 60% right carotid stenosis with no significant stenosis on the left.  He is on aspirin and he is on a statin.  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  He has had some decline in Darren neurocognitive function and is no longer driving.  He denies any recent focal weakness or paresthesias.  He denies any expressive or receptive aphasia or amaurosis fugax.  Past Medical History:  Diagnosis Date   Asthma    Cataract    Stroke [facility])     Family History  Problem Relation Age of Onset   Heart attack Mother        81   Heart attack Father        48   Cancer Brother        unknown type   Bipolar disorder Daughter    Arthritis Sister        back surgery and  complications- ongoing antibiotics    SOCIAL HISTORY: Social History   Tobacco Use   Smoking status: Former    Types: Cigarettes, Pipe   Smokeless tobacco: Never   Tobacco comments:    quit prior to 1972  Substance Use Topics   Alcohol use: Not Currently    Alcohol/week: 0.0 standard drinks of alcohol    No Known Allergies  Current Outpatient Medications  Medication Sig Dispense Refill   albuterol (VENTOLIN HFA) 108 (90 Base) MCG/ACT inhaler INHALE 2 PUFFS INTO THE LUNGS EVERY 6 HOURS AS NEEDED FOR WHEEZING OR SHORTNESS OF BREATH 8.5 g 2   amLODipine-benazepril (LOTREL) 10-20 MG capsule TAKE 1 CAPSULE BY MOUTH EVERY DAY 90 capsule 1   ASPIRIN LOW DOSE 81 MG tablet TAKE 1 TABLET BY MOUTH EVERY DAY 90 tablet 3   atorvastatin (LIPITOR) 40 MG tablet TAKE 1 TABLET BY MOUTH EVERY DAY 90 tablet 4   carbidopa-levodopa (SINEMET CR) 50-200 MG tablet TAKE 1 TABLET BY MOUTH AT BEDTIME 90 tablet 0   carbidopa-levodopa (SINEMET IR) 25-100 MG tablet TAKE 2 TABLETS BY MOUTH 2 TIMES DAILY morning an noon and TAKE 1 TABLET EVERY DAY AT 4PM 150 tablet 0   D3  SUPER STRENGTH 50 MCG (2000 UT) CAPS TAKE 2 CAPSULES BY MOUTH EVERY DAY 180 capsule 3   famotidine (PEPCID) 20 MG tablet Take 1 tablet (20 mg total) by mouth 2 (two) times daily. 180 tablet 3   fluticasone (FLONASE) 50 MCG/ACT nasal spray USE 2 SPRAYS in each nostril 2 TIMES DAILY 16 g 0   fluticasone-salmeterol (ADVAIR) 100-50 MCG/ACT AEPB Inhale 1 puff into the lungs 2 (two) times daily. 1 each 11   omeprazole (PRILOSEC) 20 MG capsule TAKE 1 CAPSULE BY MOUTH EVERY DAY 90 capsule 3   QUEtiapine (SEROQUEL) 25 MG tablet TAKE 1 TABLET BY MOUTH AT BEDTIME. MAY TAKE 1/2 TAB DURING THE DAY IF NEEDED 135 tablet 0   No current facility-administered medications for this visit.    REVIEW OF SYSTEMS:  [X]  denotes positive finding, [ ]  denotes negative finding Cardiac  Comments:  Chest pain or chest pressure:    Shortness of breath upon exertion: x    Short of breath when lying flat:    Irregular heart rhythm:        Vascular    Pain in calf, thigh, or hip brought on by ambulation:    Pain in feet at night that wakes you up from your sleep:     Blood clot in your veins:    Leg swelling:         Pulmonary    Oxygen at home:    Productive cough:     Wheezing:         Neurologic    Sudden weakness in arms or legs:     Sudden numbness in arms or legs:     Sudden onset of difficulty speaking or slurred speech:    Temporary loss of vision in one eye:     Problems with dizziness:         Gastrointestinal    Blood in stool:     Vomited blood:         Genitourinary    Burning when urinating:     Blood in urine:        Psychiatric    Major depression:         Hematologic    Bleeding problems:    Problems with blood clotting too easily:        Skin    Rashes or ulcers:        Constitutional    Fever or chills:    -  PHYSICAL EXAM:   Vitals:   12/08/22 1340 12/08/22 1342  BP: 128/62 125/63  Pulse: (!) 20   Resp: 20   Temp: 97.9 F (36.6 C)   SpO2: 94%   Weight: 159 lb (72.1 kg)   Height: 5\' 10"  (1.778 m)    Body mass index is 22.81 kg/m. GENERAL: The patient is a well-nourished male, in no acute distress. The vital signs are documented above. CARDIAC: There is a regular rate and rhythm.  VASCULAR: I do not detect carotid bruits. He has palpable femoral pulses. I cannot palpate pedal pulses however he has a biphasic dorsalis pedis and posterior tibial signal bilaterally. PULMONARY: There is good air exchange bilaterally without wheezing or rales. ABDOMEN: Soft and non-tender with normal pitched bowel sounds.  I do not palpate an aneurysm. MUSCULOSKELETAL: There are no major deformities. NEUROLOGIC: No focal weakness or paresthesias are detected. SKIN: There are no ulcers or rashes noted. PSYCHIATRIC: The patient has a normal affect.  DATA:    CAROTID DUPLEX: I  have reviewed Darren carotid duplex scan that  was done on 12/01/2022.  This was done elsewhere.  On the right side he had a greater than 70% stenosis.  The end-diastolic velocity on the right was 90 cm/s and based on our velocity criteria this would suggest a less than 80% stenosis.  The right vertebral artery is patent with antegrade flow.  On the left side there was no significant stenosis.  The left vertebral artery was patent with antegrade flow.  Deitra [facility]

## 2022-12-12 IMAGING — RF DG SWALLOWING FUNCTION
12 of 24 series · 12 of 24 positions shown · non-contrast
Comparison: None.

CLINICAL DATA: Provided history: Dysphagia, unspecified type.
Cough, unspecified type. Dysphagia. Additional history provided:
Difficulty swallowing solids, coughing after solids, history of
strokes.

EXAM:
MODIFIED BARIUM SWALLOW
TECHNIQUE: Different consistencies of barium were administered orally to the
patient by the Speech Pathologist. Imaging of the pharynx was
performed in the lateral projection. Sobue, Toranosuke was present
in the fluoroscopy suite and operated the fluoroscopy equipment.
FLUOROSCOPY:
Fluoroscopy time: 4 minutes
Radiation Exposure Index (as provided by the fluoroscopic device):
34.41 mGy Kerma

[Series 2: run · 1 of 35 frames shown (1 of 12)]
[frame 6/35]
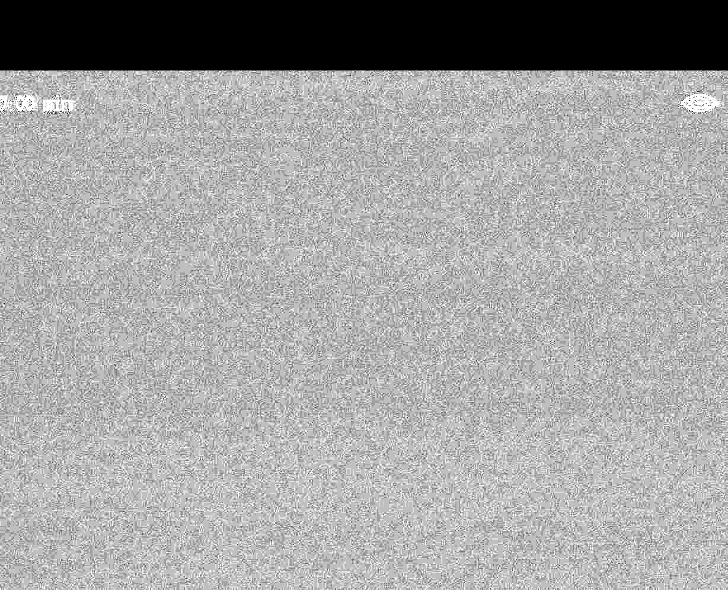

[Series 4: run · 1 of 17 frames shown (2 of 12)]
[frame 9/17]
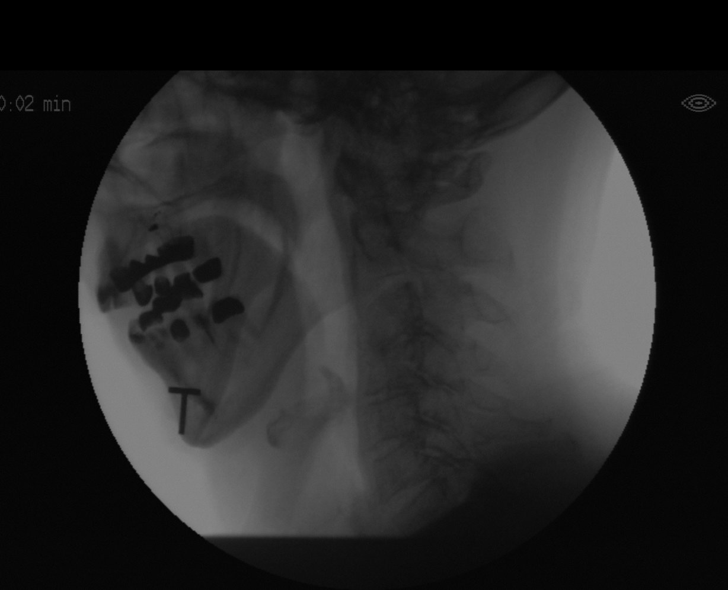

[Series 6: run · 1 of 305 frames shown (3 of 12)]
[frame 46/305]
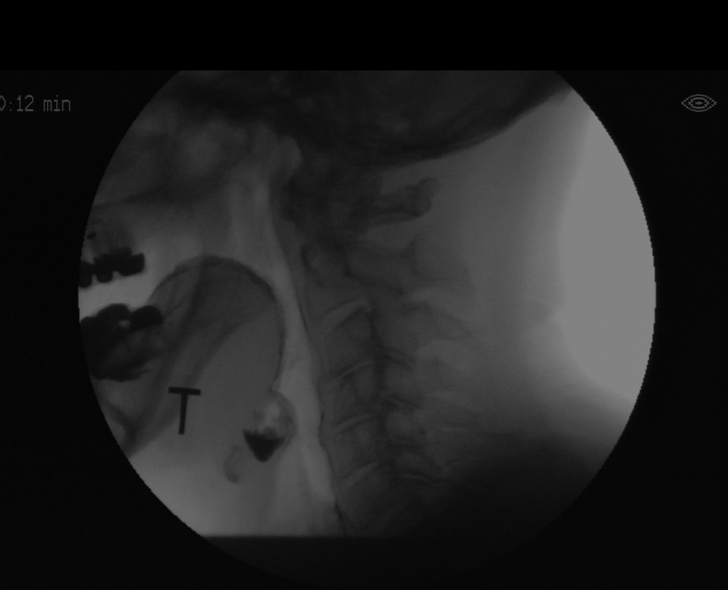

[Series 8: run · 1 of 253 frames shown (4 of 12)]
[frame 127/253]
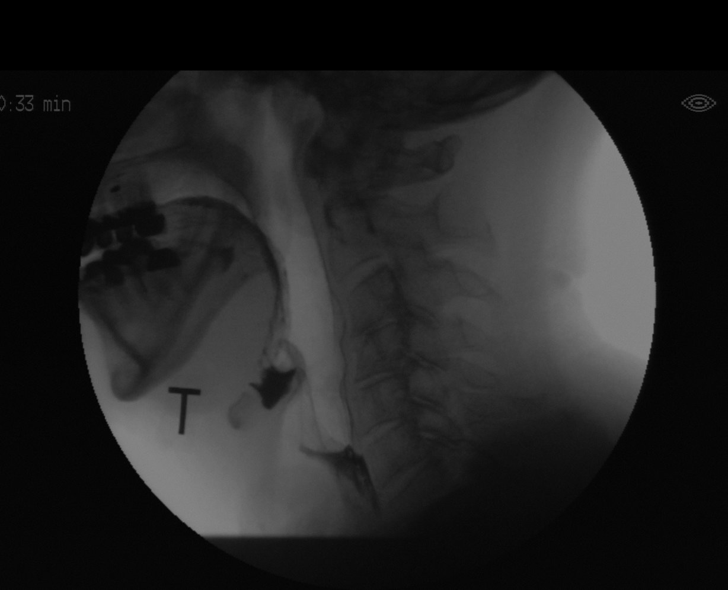

[Series 10: run · 1 of 311 frames shown (5 of 12)]
[frame 265/311]
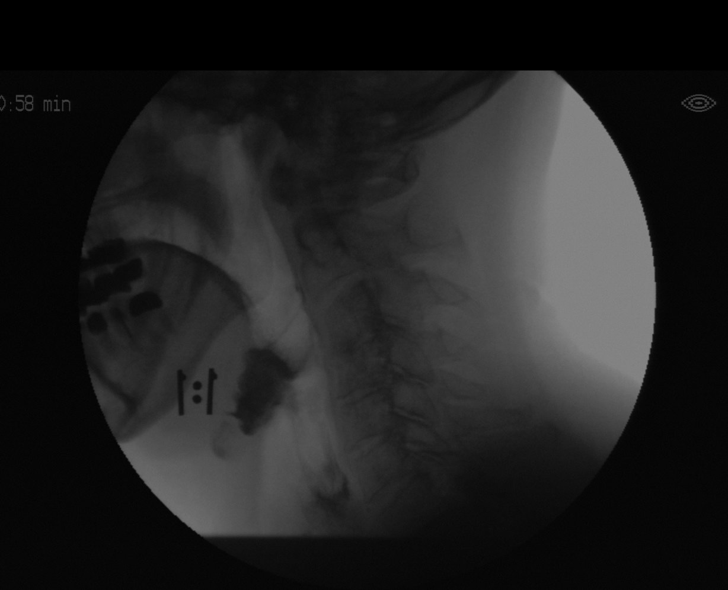

[Series 12: run · 1 of 492 frames shown (6 of 12)]
[frame 247/492]
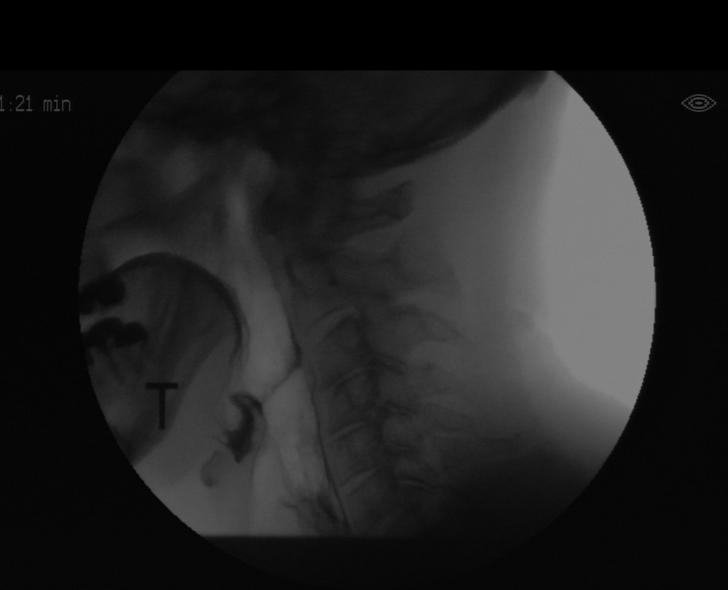

[Series 14: run · 1 of 130 frames shown (7 of 12)]
[frame 78/130]
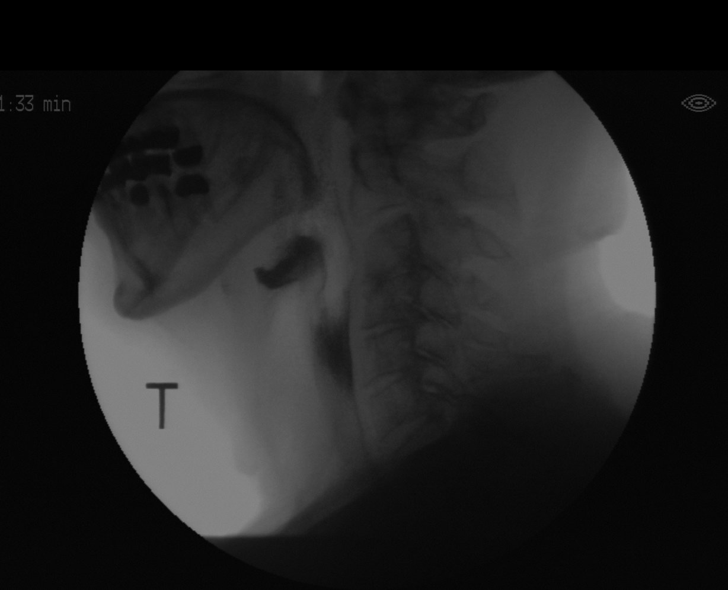

[Series 16: run · 1 of 29 frames shown (8 of 12)]
[frame 15/29]
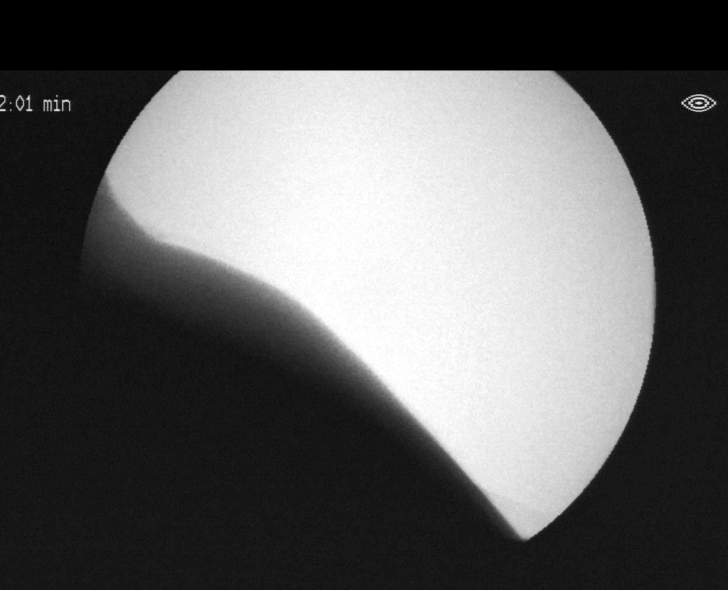

[Series 18: run · 1 of 284 frames shown (9 of 12)]
[frame 143/284]
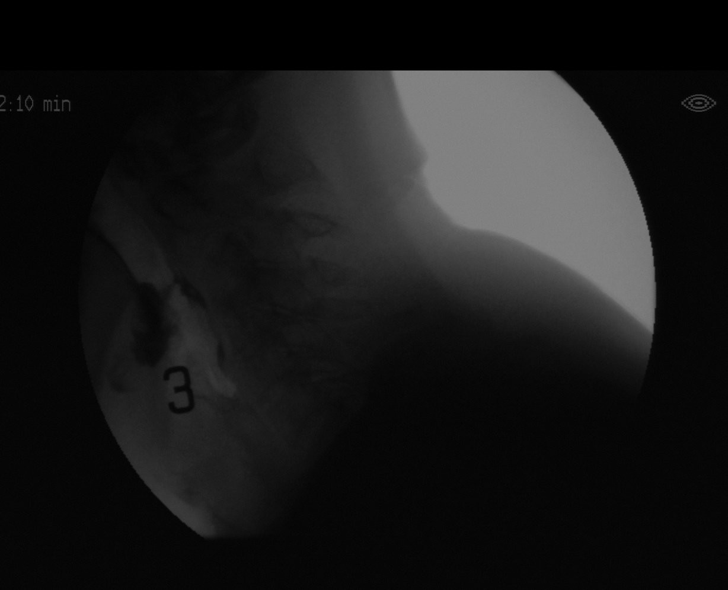

[Series 20: run · 1 of 348 frames shown (10 of 12)]
[frame 296/348]
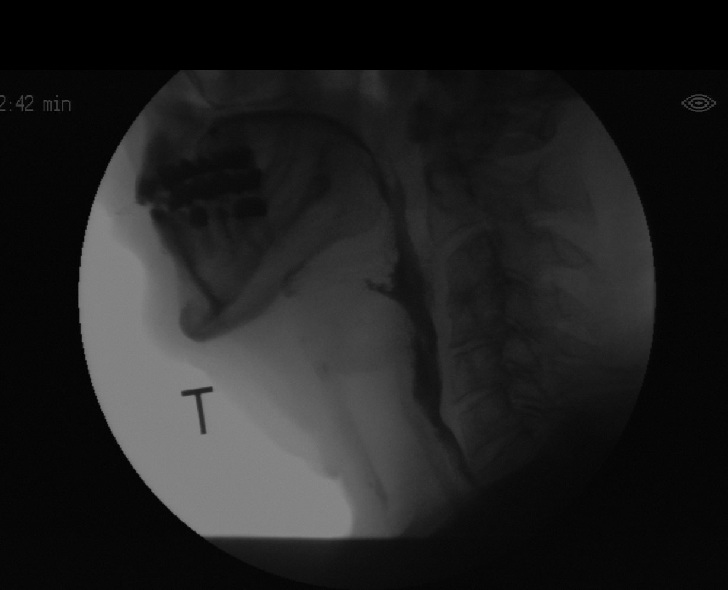

[Series 22: run · 1 of 121 frames shown (11 of 12)]
[frame 108/121]
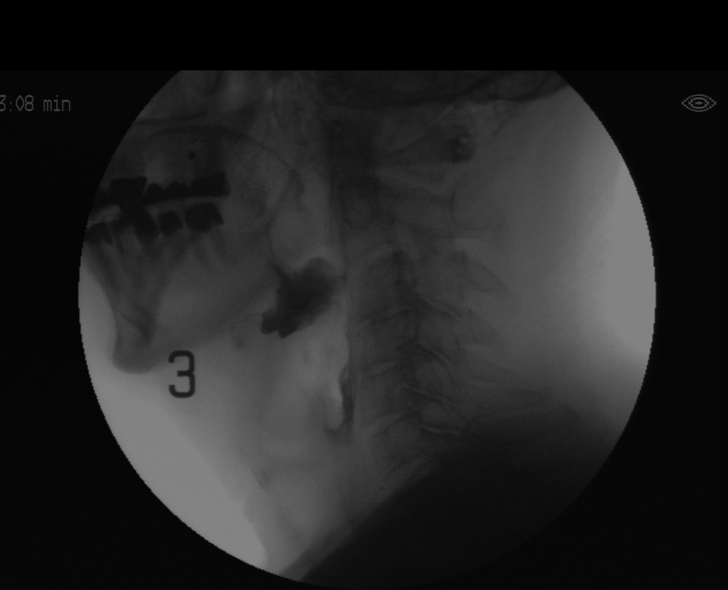

[Series 24: run · 1 of 162 frames shown (12 of 12)]
[frame 138/162]
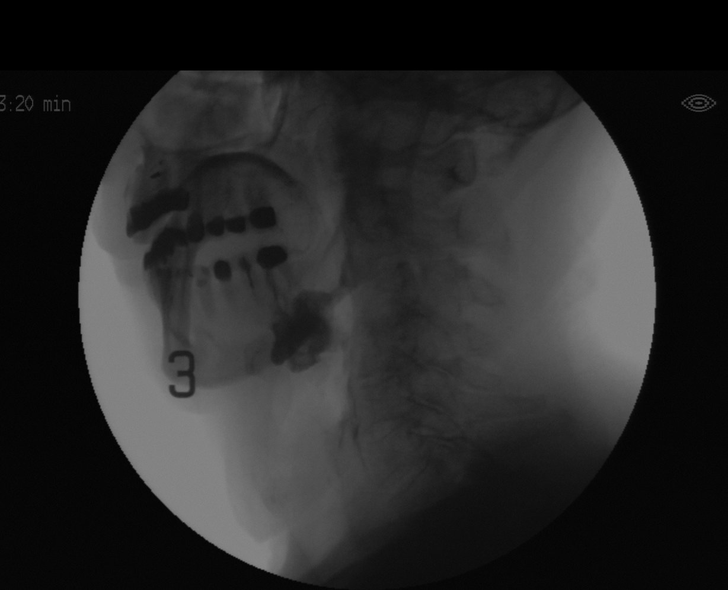

[12 of 24 positions shown; findings below may reference images not displayed]

FINDINGS: Different consistencies of barium were administered orally to the
patient by the Speech Pathologist with fluoroscopic imaging of the
pharynx, as well as limited imaging of the esophagus, from a lateral
projection. Sobue, Toranosuke was present in the fluoroscopy suite
and operated the fluoroscopy equipment. The speech pathologist
observed aspiration with thin consistency. Please refer to the
Speech Pathologist's report for full details.
IMPRESSION: Modified barium swallow, as described.

Aspiration was observed with thin consistency.

Please refer to the Speech Pathologist's report for full details and
recommendations.

## 2022-12-15 ENCOUNTER — Other Ambulatory Visit: Payer: Self-pay | Admitting: Neurology

## 2022-12-15 ENCOUNTER — Other Ambulatory Visit: Payer: Self-pay

## 2022-12-15 DIAGNOSIS — I6521 Occlusion and stenosis of right carotid artery: Secondary | ICD-10-CM

## 2023-01-02 ENCOUNTER — Ambulatory Visit: Payer: Medicare Other | Admitting: Surgery

## 2023-01-03 NOTE — Progress Notes (Signed)
Assessment/Plan:   1.  Parkinsons Disease, diagnosed 2018  -continue carbidopa/levodopa 25/100, 2/2/1  -Continue carbidopa/levodopa 50/200 CR at bedtime  -wife has POA for healthcare and finances.  -given U step to trial in the office.  He liked it and did well with it and RX given  -PT order for legacy  2.  History of cerebral infarct, June, 2021 with right-sided symptoms  -Last carotid ultrasound was 60 to 79% right carotid stenosis.  He is following with vascular surgery.  -On aspirin, 81 mg daily.  -On Lipitor, 40 mg daily.  LDL was at goal.  Primary care monitoring.  3.  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  He is not driving.  -living [facility] - independent with wife for now but looking into more step up care  -Patient DNR  -increase quetiapine 25 mg, 1/2-1 at dinner, continue 1 at bed  -discussed nuplazid.  He likes quetiapine b/c helps him sleep  4.  Pharyngeal dysphagia  -Had MBE on Apr 07, 2022 with recommendations for mechanical soft with thin liquids.   Subjective:   NASZIR RIGANO was seen today in follow up for Parkinsons disease.  My previous records were reviewed prior to todays visit as well as outside records available to me.   Patient accompanied by wife and son (a Software engineer) who supplements visit.  He did have a carotid ultrasound since our last visit that demonstrated to 60 to 79% carotid stenosis on the right.  He subsequently saw Dr. Scot Dock and they are just going to follow him every 6 months.  He has been in physical therapy since our last visit.  He did want to try the U step walker today. Having trouble with choking.  They are trying to modify the diet.  More bothersome hallucinations  Current prescribed movement disorder medications: Carbidopa/levodopa 25/100, 2/2/1 Carbidopa/levodopa 50/200 CR at bedtime  Quetiapine, 25 mg, half tablet in the daytime if needed, 1 tablet at nighttime   PREVIOUS MEDICATIONS:  propranolol  ALLERGIES:  No Known Allergies  CURRENT MEDICATIONS:  Outpatient Encounter Medications as of 01/12/2023  Medication Sig   albuterol (VENTOLIN HFA) 108 (90 Base) MCG/ACT inhaler INHALE 2 PUFFS INTO THE LUNGS EVERY 6 HOURS AS NEEDED FOR WHEEZING OR SHORTNESS OF BREATH   amLODipine-benazepril (LOTREL) 10-20 MG capsule TAKE 1 CAPSULE BY MOUTH EVERY DAY   ASPIRIN LOW DOSE 81 MG tablet TAKE 1 TABLET BY MOUTH EVERY DAY   atorvastatin (LIPITOR) 40 MG tablet TAKE 1 TABLET BY MOUTH EVERY DAY   carbidopa-levodopa (SINEMET CR) 50-200 MG tablet TAKE 1 TABLET BY MOUTH AT BEDTIME   carbidopa-levodopa (SINEMET IR) 25-100 MG tablet TAKE 2 TABLETS BY MOUTH 2 TIMES DAILY morning an noon and TAKE 1 TABLET EVERY DAY AT 4PM   D3 SUPER STRENGTH 50 MCG (2000 UT) CAPS TAKE 2 CAPSULES BY MOUTH EVERY DAY   famotidine (PEPCID) 20 MG tablet Take 1 tablet (20 mg total) by mouth 2 (two) times daily.   fluticasone (FLONASE) 50 MCG/ACT nasal spray USE 2 SPRAYS in each nostril 2 TIMES DAILY   fluticasone-salmeterol (ADVAIR) 100-50 MCG/ACT AEPB Inhale 1 puff into the lungs 2 (two) times daily.   omeprazole (PRILOSEC) 20 MG capsule TAKE 1 CAPSULE BY MOUTH EVERY DAY   QUEtiapine (SEROQUEL) 25 MG tablet TAKE 1 TABLET BY MOUTH AT BEDTIME. MAY TAKE 1/2 TABLET DURING THE DAY IF NEEDED   [DISCONTINUED] QUEtiapine (SEROQUEL) 25 MG tablet TAKE 1 TABLET BY MOUTH AT BEDTIME.  MAY TAKE 1/2 TABLET DURING THE DAY IF NEEDED   No facility-administered encounter medications on file as of 01/12/2023.    Objective:   PHYSICAL EXAMINATION:    VITALS:   Vitals:   01/12/23 0909  BP: 122/66  Pulse: 68  SpO2: 95%  Weight: 160 lb 8 oz (72.8 kg)  Height: 5' 9"$  (1.753 m)    Wt Readings from Last 3 Encounters:  01/12/23 160 lb 8 oz (72.8 kg)  12/08/22 159 lb (72.1 kg)  07/14/22 153 lb (69.4 kg)       GEN:  The patient appears stated age and is in NAD. HEENT:  Normocephalic, atraumatic.  The mucous membranes are moist.    Neurological examination:  Orientation: The patient is alert and oriented to person.  Looks to family for more detailed hx Cranial nerves: There is good facial symmetry with facial hypomimia. The speech is fluent and clear. Soft palate rises symmetrically and there is no tongue deviation. Hearing is intact to conversational tone. Sensation: Sensation is intact to light touch throughout Motor: Strength is at least antigravity x4.  Movement examination: Tone: There is nl tone today (improved) Abnormal movements: none today Coordination:  There is no decremation Gait and Station: The patient pushes off to arise.  The patient ambulates well with the U step  I have reviewed and interpreted the following labs independently    Chemistry      Component Value Date/Time   NA 143 07/14/2022 0925   K 4.6 07/14/2022 0925   CL 107 07/14/2022 0925   CO2 25 07/14/2022 0925   BUN 34 (H) 07/14/2022 0925   CREATININE 1.35 07/14/2022 0925   CREATININE 1.59 (H) 11/27/2020 1514      Component Value Date/Time   CALCIUM 9.8 07/14/2022 0925   ALKPHOS 62 07/14/2022 0925   AST 16 07/14/2022 0925   ALT 3 07/14/2022 0925   BILITOT 0.6 07/14/2022 0925       Lab Results  Component Value Date   WBC 6.6 07/14/2022   HGB 13.2 07/14/2022   HCT 39.4 07/14/2022   MCV 93.5 07/14/2022   PLT 233.0 07/14/2022    Lab Results  Component Value Date   TSH 1.63 07/14/2022    Total time spent on today's visit was 30 minutes, including both face-to-face time and nonface-to-face time.  Time included that spent on review of records (prior notes available to me/labs/imaging if pertinent), discussing treatment and goals, answering patient's questions and coordinating care.    Cc:  Marin Olp, MD

## 2023-01-10 ENCOUNTER — Other Ambulatory Visit: Payer: Self-pay | Admitting: Neurology

## 2023-01-10 NOTE — Telephone Encounter (Signed)
Spoke to patients wife and they have enough meds until appointment on Thursday with dr. Carles Collet

## 2023-01-10 NOTE — Telephone Encounter (Signed)
Patient is coming in on Thursday do you want me to wait to refill this Quetiapine?

## 2023-01-12 ENCOUNTER — Ambulatory Visit (INDEPENDENT_AMBULATORY_CARE_PROVIDER_SITE_OTHER): Payer: Medicare Other | Admitting: Neurology

## 2023-01-12 ENCOUNTER — Encounter: Payer: Self-pay | Admitting: Neurology

## 2023-01-12 VITALS — BP 122/66 | HR 68 | Ht 69.0 in | Wt 160.5 lb

## 2023-01-12 DIAGNOSIS — I6521 Occlusion and stenosis of right carotid artery: Secondary | ICD-10-CM

## 2023-01-12 DIAGNOSIS — G20A1 Parkinson's disease without dyskinesia, without mention of fluctuations: Secondary | ICD-10-CM

## 2023-01-12 DIAGNOSIS — R441 Visual hallucinations: Secondary | ICD-10-CM | POA: Diagnosis not present

## 2023-01-12 MED ORDER — QUETIAPINE FUMARATE 25 MG PO TABS
ORAL_TABLET | ORAL | 1 refills | Status: DC
Start: 2023-01-12 — End: 2023-06-03

## 2023-01-12 MED ORDER — AMBULATORY NON FORMULARY MEDICATION
0 refills | Status: DC
Start: 2023-01-12 — End: 2023-04-27

## 2023-01-18 ENCOUNTER — Other Ambulatory Visit: Payer: Self-pay | Admitting: Neurology

## 2023-01-18 DIAGNOSIS — G20A1 Parkinson's disease without dyskinesia, without mention of fluctuations: Secondary | ICD-10-CM

## 2023-01-19 ENCOUNTER — Ambulatory Visit: Payer: Medicare Other | Admitting: Family Medicine

## 2023-01-19 MED ORDER — AMLODIPINE BESY-BENAZEPRIL HCL 10-20 MG PO CAPS: ORAL_CAPSULE | ORAL | 3 refills | Status: DC

## 2023-01-19 NOTE — Progress Notes (Signed)
Patient is struggling with mobility-unable to make it into the office today-has recently seen Dr. Carles Collet.  We considered virtual visit but he does not have substantial needs at this time-we opted to refill his blood pressure medicine and encouraged 46-monthfollow-up-at least twice each other annually (we will actually check in at his wife's 32-monthisit to determine about 6-77-monthsit/scheduling)

## 2023-01-29 ENCOUNTER — Encounter: Payer: Self-pay | Admitting: Family Medicine

## 2023-01-30 ENCOUNTER — Other Ambulatory Visit: Payer: Self-pay

## 2023-01-30 DIAGNOSIS — R300 Dysuria: Secondary | ICD-10-CM

## 2023-01-31 ENCOUNTER — Other Ambulatory Visit (INDEPENDENT_AMBULATORY_CARE_PROVIDER_SITE_OTHER): Payer: Medicare Other

## 2023-01-31 DIAGNOSIS — R300 Dysuria: Secondary | ICD-10-CM

## 2023-01-31 LAB — URINALYSIS, ROUTINE W REFLEX MICROSCOPIC
Bilirubin Urine: NEGATIVE
Hgb urine dipstick: NEGATIVE
Leukocytes,Ua: NEGATIVE
Nitrite: NEGATIVE
Specific Gravity, Urine: 1.02 (ref 1.000–1.030)
Total Protein, Urine: NEGATIVE
Urine Glucose: NEGATIVE
Urobilinogen, UA: 0.2 (ref 0.0–1.0)
pH: 6 (ref 5.0–8.0)

## 2023-02-01 LAB — URINE CULTURE
MICRO NUMBER:: 14681357
SPECIMEN QUALITY:: ADEQUATE

## 2023-02-09 ENCOUNTER — Other Ambulatory Visit: Payer: Self-pay | Admitting: Neurology

## 2023-02-09 DIAGNOSIS — G20A1 Parkinson's disease without dyskinesia, without mention of fluctuations: Secondary | ICD-10-CM

## 2023-03-10 ENCOUNTER — Encounter: Payer: Self-pay | Admitting: Neurology

## 2023-03-10 ENCOUNTER — Encounter: Payer: Self-pay | Admitting: Family Medicine

## 2023-03-10 NOTE — Telephone Encounter (Signed)
HI! I called this patient already and agrees to Dr Pamala Hurry message - can you please add him for a vv for 4/23 at [phone] please :)   Thanks!!

## 2023-03-30 ENCOUNTER — Encounter (HOSPITAL_COMMUNITY): Payer: Self-pay

## 2023-03-30 ENCOUNTER — Emergency Department (HOSPITAL_COMMUNITY): Payer: Medicare Other

## 2023-03-30 ENCOUNTER — Other Ambulatory Visit: Payer: Self-pay

## 2023-03-30 ENCOUNTER — Emergency Department (HOSPITAL_COMMUNITY)
Admission: EM | Admit: 2023-03-30 | Discharge: 2023-03-31 | Disposition: A | Discharge status: Skilled Nursing Facility | Payer: Medicare Other | Attending: Student | Admitting: Student

## 2023-03-30 DIAGNOSIS — Z7951 Long term (current) use of inhaled steroids: Secondary | ICD-10-CM | POA: Diagnosis not present

## 2023-03-30 DIAGNOSIS — S6992XA Unspecified injury of left wrist, hand and finger(s), initial encounter: Secondary | ICD-10-CM | POA: Diagnosis present

## 2023-03-30 DIAGNOSIS — N2889 Other specified disorders of kidney and ureter: Secondary | ICD-10-CM | POA: Insufficient documentation

## 2023-03-30 DIAGNOSIS — Z20822 Contact with and (suspected) exposure to covid-19: Secondary | ICD-10-CM | POA: Insufficient documentation

## 2023-03-30 DIAGNOSIS — W19XXXA Unspecified fall, initial encounter: Secondary | ICD-10-CM | POA: Insufficient documentation

## 2023-03-30 DIAGNOSIS — R509 Fever, unspecified: Secondary | ICD-10-CM | POA: Diagnosis not present

## 2023-03-30 DIAGNOSIS — R531 Weakness: Secondary | ICD-10-CM | POA: Insufficient documentation

## 2023-03-30 DIAGNOSIS — S61412A Laceration without foreign body of left hand, initial encounter: Secondary | ICD-10-CM | POA: Diagnosis not present

## 2023-03-30 DIAGNOSIS — B349 Viral infection, unspecified: Secondary | ICD-10-CM | POA: Diagnosis not present

## 2023-03-30 DIAGNOSIS — G20A1 Parkinson's disease without dyskinesia, without mention of fluctuations: Secondary | ICD-10-CM | POA: Insufficient documentation

## 2023-03-30 DIAGNOSIS — J45909 Unspecified asthma, uncomplicated: Secondary | ICD-10-CM | POA: Diagnosis not present

## 2023-03-30 DIAGNOSIS — Y92129 Unspecified place in nursing home as the place of occurrence of the external cause: Secondary | ICD-10-CM | POA: Insufficient documentation

## 2023-03-30 MED ORDER — ACETAMINOPHEN 500 MG PO TABS
500.0000 mg | ORAL_TABLET | Freq: Once | ORAL | Status: AC
  Administered 2023-03-31: 500 mg via ORAL
  Filled 2023-03-30: qty 1

## 2023-03-30 MED ORDER — SODIUM CHLORIDE 0.9 % IV SOLN
500.0000 mg | INTRAVENOUS | Status: DC
  Administered 2023-03-31: 500 mg via INTRAVENOUS
  Filled 2023-03-30: qty 5

## 2023-03-30 MED ORDER — LACTATED RINGERS IV SOLN: INTRAVENOUS | Status: DC

## 2023-03-30 MED ORDER — SODIUM CHLORIDE 0.9 % IV SOLN
2.0000 g | INTRAVENOUS | Status: DC
  Administered 2023-03-31: 2 g via INTRAVENOUS
  Filled 2023-03-30: qty 20

## 2023-03-30 NOTE — ED Triage Notes (Signed)
BIB EMS from [facility] for increased frequency of falls. Pt fell Monday and again today. Family reports pt has been more weak than usual. Pt c/o fatigue. Pt has skin tears from from fall, no other injuries. No LOC, no blood thinners.  Pt had 101.4 fever with EMS, had 500mg  Tylenol at 2207. Hx of Parkinsons

## 2023-03-30 NOTE — ED Provider Notes (Signed)
[facility] Provider Note   CSN: [id number] Arrival date & time: 03/30/23  2236     History  Chief Complaint  Patient presents with   Fall   Fever    Darren [name] is a 89 y.o. male.   Fall  Fever  Patient is an 89 year old male with past medical history significant for Parkinson's disease and history of stroke and asthma presents emergency room today with complaints of weakness  According to son, Rowdy, patient's mental status and physical abilities have slowly declined over the past 3 years however over the past 5 days patient has become significantly more weak and fatigued has had several falls and seemed more confused.  Seems that on Monday patient fell and hurt his left hand mostly thought to be a skin tear and this was cleaned and dressed but no x-rays were done.  He then became progressively more weak fell today around 3 PM and then by 8:30 PM was unable to ambulate due to weakness.  EMS was called and found patient to be febrile he was given Tylenol and brought to the emergency room.  Per son the patient normally walks with a cane.  He has been coughing frequently when eating food no history of aspiration pneumonia and no history of UTIs.  Was recently tested for UTI several weeks ago which was negative.     Home Medications Prior to Admission medications   Medication Sig Start Date End Date Taking? Authorizing Provider  albuterol (VENTOLIN HFA) 108 (90 Base) MCG/ACT inhaler INHALE 2 PUFFS INTO THE LUNGS EVERY 6 HOURS AS NEEDED FOR WHEEZING OR SHORTNESS OF BREATH 08/03/21   [name], MD  AMBULATORY NON FORMULARY MEDICATION U step walker with laser 01/12/23   Tat, Octaviano Batty, DO  amLODipine-benazepril (LOTREL) 10-20 MG capsule TAKE 1 CAPSULE BY MOUTH EVERY DAY 01/19/23   [name], MD  ASPIRIN LOW DOSE 81 MG tablet TAKE 1 TABLET BY MOUTH EVERY DAY 10/19/22   [name], MD  atorvastatin (LIPITOR) 40 MG  tablet TAKE 1 TABLET BY MOUTH EVERY DAY 06/22/22   [name], MD  carbidopa-levodopa (SINEMET CR) 50-200 MG tablet TAKE 1 TABLET BY MOUTH AT BEDTIME 02/09/23   Tat, Rebecca S, DO  carbidopa-levodopa (SINEMET IR) 25-100 MG tablet TAKE 2 TABLETS BY MOUTH 2 TIMES DAILY morning an noon and TAKE 1 TABLET EVERY DAY AT 4PM 01/19/23   Tat, Octaviano Batty, DO  D3 SUPER STRENGTH 50 MCG (2000 UT) CAPS TAKE 2 CAPSULES BY MOUTH EVERY DAY 10/19/22   [name], MD  famotidine (PEPCID) 20 MG tablet Take 1 tablet (20 mg total) by mouth 2 (two) times daily. 07/14/22   [name], MD  fluticasone Aleda Grana) 50 MCG/ACT nasal spray USE 2 SPRAYS in each nostril 2 TIMES DAILY 07/16/21   [name], MD  fluticasone-salmeterol (ADVAIR) 100-50 MCG/ACT AEPB Inhale 1 puff into the lungs 2 (two) times daily. 07/06/21   [name], MD  omeprazole (PRILOSEC) 20 MG capsule TAKE 1 CAPSULE BY MOUTH EVERY DAY 08/16/22   [name], MD  QUEtiapine (SEROQUEL) 25 MG tablet 1/2-1 tablet at 4pm, 1 po q hs 01/12/23   Tat, Octaviano Batty, DO      Allergies    Patient has no known allergies.    Review of Systems   Review of Systems  Constitutional:  Positive for fever.    Physical Exam Updated Vital Signs  BP (!) 157/73   Pulse 90   Temp 98.4 F (36.9 C) (Oral)   Resp 18   Ht 5\' 9"  (1.753 m)   Wt 72.8 kg   SpO2 96%   BMI 23.70 kg/m  Physical Exam Vitals and nursing note reviewed.  Constitutional:      General: He is not in acute distress. HENT:     Head: Normocephalic and atraumatic.     Nose: Nose normal.  Eyes:     General: No scleral icterus. Cardiovascular:     Rate and Rhythm: Normal rate and regular rhythm.     Pulses: Normal pulses.     Heart sounds: Normal heart sounds.  Pulmonary:     Effort: Pulmonary effort is normal. No respiratory distress.     Breath sounds: No wheezing.  Abdominal:     Palpations: Abdomen is soft.     Tenderness: There is no abdominal tenderness.   Musculoskeletal:     Cervical back: Normal range of motion.     Right lower leg: No edema.     Left lower leg: No edema.  Skin:    General: Skin is warm and dry.     Capillary Refill: Capillary refill takes less than 2 seconds.     Comments: No sacral ulcers, feet are without wounds or ulceration   Skin tear to left hand pictured below.  Wound is clean with no erythema or purulence.  Neurological:     Mental Status: He is alert. Mental status is at baseline.  Psychiatric:        Mood and Affect: Mood normal.        Behavior: Behavior normal.     ED Results / Procedures / Treatments   Labs (all labs ordered are listed, but only abnormal results are displayed) Labs Reviewed  RESP PANEL BY RT-PCR (RSV, FLU A&B, COVID)  RVPGX2  CULTURE, BLOOD (ROUTINE X 2)  CULTURE, BLOOD (ROUTINE X 2)  LACTIC ACID, PLASMA  LACTIC ACID, PLASMA  COMPREHENSIVE METABOLIC PANEL  CBC WITH DIFFERENTIAL/PLATELET  PROTIME-INR  APTT  URINALYSIS, W/ REFLEX TO CULTURE (INFECTION SUSPECTED)    EKG None  Radiology No results found.  Procedures Procedures    Medications Ordered in ED Medications  lactated ringers infusion (has no administration in time range)  cefTRIAXone (ROCEPHIN) 2 g in sodium chloride 0.9 % 100 mL IVPB (has no administration in time range)  azithromycin (ZITHROMAX) 500 mg in sodium chloride 0.9 % 250 mL IVPB (has no administration in time range)  acetaminophen (TYLENOL) tablet 500 mg (has no administration in time range)    ED Course/ Medical Decision Making/ A&P Clinical Course as of 03/31/23 0537  Thu Mar 30, 2023  2338 Slow decline over the past 3 years with worsening some hallucinations/dementia.   Fall on Monday damaged hand (L). Not able to have a conversation anymore. Fell around 3-4pm. 8:30pm he couldn't walk at all.  Normally walks with a cane. Has been coughing frequently on food. No hx of UTI/PNA recently.  [WF]  Fri Mar 31, 2023  0155 CBC with  Differential(!) Mild anemia [WF]    Clinical Course User Index [WF] Gailen Shelter, [place]                             Medical Decision Making Amount and/or Complexity of Data Reviewed Labs: ordered. Decision-making details documented in ED Course. Radiology: ordered. ECG/medicine tests: ordered.  Risk OTC drugs.  Prescription drug management.    This patient presents to the ED for concern of fall, fever, this involves a number of treatment options, and is a complaint that carries with it a moderate to high risk of complications and morbidity. A differential diagnosis was considered for the patient's symptoms which is discussed below:   Considered sepsis however patient is afebrile well after 500 mg of Tylenol normal vital signs overall well-appearing does not meet sepsis criteria however did have a fever with EMS.  Will obtain labs and continue evaluate will provide an empiric dose of broad-spectrum antibiotics Rocephin and azithromycin to cover for urinary and respiratory source  Considered pneumonia, UTI, less likely be something such as meningitis as patient seems to be at his approximately normal mental statu and has no meningismus   Co morbidities: Discussed in HPI   Brief History:  Patient is an 89 year old male with past medical history significant for Parkinson's disease and history of stroke and asthma presents emergency room today with complaints of weakness  According to son, Rowdy, patient's mental status and physical abilities have slowly declined over the past 3 years however over the past 5 days patient has become significantly more weak and fatigued has had several falls and seemed more confused.  Seems that on Monday patient fell and hurt his left hand mostly thought to be a skin tear and this was cleaned and dressed but no x-rays were done.  He then became progressively more weak fell today around 3 PM and then by 8:30 PM was unable to ambulate due to weakness.   EMS was called and found patient to be febrile he was given Tylenol and brought to the emergency room.  Per son the patient normally walks with a cane.  He has been coughing frequently when eating food no history of aspiration pneumonia and no history of UTIs.  Was recently tested for UTI several weeks ago which was negative.    EMR reviewed including pt PMHx, past surgical history and past visits to ER.   See HPI for more details   Lab Tests:   I ordered and independently interpreted labs. Labs notable for Urinalysis without evidence of infection, respiratory panel negative and I suspect this nontested respiratory viruses source, blood cultures were obtained and are pending urine culture also pending CBC without leukocytosis mild anemia CMP unremarkable normal kidney function lactic acid level normal  Imaging Studies:  NAD. I personally reviewed all imaging studies and no acute abnormality found. I agree with radiology interpretation.  Chronic changes on CT head IMPRESSION:  1. Generalized cerebral atrophy and chronic white matter small  vessel ischemic change, without evidence of an acute intracranial  abnormality.  2. Chronic right frontal lobe infarct.      Electronically Signed    By: Aram Candela M.D.    On: 03/30/2023 23:47     CT abdomen pelvis with incidental renal mass but no source of infection  DG wrist no fracture  DG chest unremarkable  Cardiac Monitoring:  The patient was maintained on a cardiac monitor.  I personally viewed and interpreted the cardiac monitored which showed an underlying rhythm of: NSR EKG non-ischemic   Medicines ordered:  I ordered medication including azithro/rocephin abx, tylenol, LR infusion for gentle hydration and valium  for some aggitation (after workup was complete).  Reevaluation of the patient after these medicines showed that the patient resolved I have reviewed the patients home medicines and have made adjustments  as needed  Critical Interventions:     Consults/Attending Physician   I discussed this case with my attending physician who cosigned this note including patient's presenting symptoms, physical exam, and planned diagnostics and interventions. Attending physician stated agreement with plan or made changes to plan which were implemented.   Attending physician assessed patient at bedside.    Reevaluation:  After the interventions noted above I re-evaluated patient and found that they have :stayed the same   Social Determinants of Health:      Problem List / ED Course:  Patient is a 89 year old male DNR/DNI end-stage Parkinson's here with fever has been coughing and sinus congestion over the past few days symptoms consistent with viral illness workup here reassuring is had a very thorough workup with CT abdomen pelvis without evidence of intra-abdominal infection no evidence of UTI or pneumonia.  I doubt central infection such as meningitis cerebritis encephalitis although difficult to assess given his mental status he has no meningismus.  I had a lengthy shared decision-making conversation with patient's wife and the patient's caregiver he has 24/7 caregiver and is in a assisted living facility and may be able to switch over to full nursing facility while at home.  Wife is agreeable to plan to discharge home and treat fever with Tylenol. Found to have a renal mass.  Will likely benefit from ultrasound outpatient.  Family aware of this.  Renal mass diagnosis placed in   Dispostion:  After consideration of the diagnostic results and the patients response to treatment, I feel that the patent would benefit from admission.  Final Clinical Impression(s) / ED Diagnoses Final diagnoses:  Fever, unspecified fever cause  Viral illness  Left renal mass    Rx / DC Orders ED Discharge Orders     None         Gailen Shelter, [place] 03/31/23 2241    Nira Conn,  MD 04/02/23 [phone]

## 2023-03-30 NOTE — ED Notes (Signed)
Pt transported to xray at this time

## 2023-03-31 ENCOUNTER — Encounter (HOSPITAL_COMMUNITY): Payer: Self-pay

## 2023-03-31 ENCOUNTER — Emergency Department (HOSPITAL_COMMUNITY): Payer: Medicare Other

## 2023-03-31 DIAGNOSIS — S61412A Laceration without foreign body of left hand, initial encounter: Secondary | ICD-10-CM | POA: Diagnosis not present

## 2023-03-31 LAB — URINALYSIS, W/ REFLEX TO CULTURE (INFECTION SUSPECTED)
Bacteria, UA: NONE SEEN
Bilirubin Urine: NEGATIVE
Glucose, UA: NEGATIVE mg/dL
Hgb urine dipstick: NEGATIVE
Ketones, ur: 5 mg/dL — AB
Leukocytes,Ua: NEGATIVE
Nitrite: NEGATIVE
Protein, ur: NEGATIVE mg/dL
Specific Gravity, Urine: 1.013 (ref 1.005–1.030)
pH: 5 (ref 5.0–8.0)

## 2023-03-31 LAB — CBC WITH DIFFERENTIAL/PLATELET
Abs Immature Granulocytes: 0.02 10*3/uL (ref 0.00–0.07)
Basophils Absolute: 0 10*3/uL (ref 0.0–0.1)
Basophils Relative: 0 %
Eosinophils Absolute: 0.1 10*3/uL (ref 0.0–0.5)
Eosinophils Relative: 1 %
HCT: 34.5 % — ABNORMAL LOW (ref 39.0–52.0)
Hemoglobin: 11.6 g/dL — ABNORMAL LOW (ref 13.0–17.0)
Immature Granulocytes: 0 %
Lymphocytes Relative: 7 %
Lymphs Abs: 0.4 10*3/uL — ABNORMAL LOW (ref 0.7–4.0)
MCH: 32 pg (ref 26.0–34.0)
MCHC: 33.6 g/dL (ref 30.0–36.0)
MCV: 95.3 fL (ref 80.0–100.0)
Monocytes Absolute: 0.6 10*3/uL (ref 0.1–1.0)
Monocytes Relative: 11 %
Neutro Abs: 4.4 10*3/uL (ref 1.7–7.7)
Neutrophils Relative %: 81 %
Platelets: 169 10*3/uL (ref 150–400)
RBC: 3.62 MIL/uL — ABNORMAL LOW (ref 4.22–5.81)
RDW: 13 % (ref 11.5–15.5)
WBC: 5.5 10*3/uL (ref 4.0–10.5)
nRBC: 0 % (ref 0.0–0.2)

## 2023-03-31 LAB — COMPREHENSIVE METABOLIC PANEL
ALT: 5 U/L (ref 0–44)
AST: 17 U/L (ref 15–41)
Albumin: 3.9 g/dL (ref 3.5–5.0)
Alkaline Phosphatase: 61 U/L (ref 38–126)
Anion gap: 9 (ref 5–15)
BUN: 22 mg/dL (ref 8–23)
CO2: 23 mmol/L (ref 22–32)
Calcium: 8.8 mg/dL — ABNORMAL LOW (ref 8.9–10.3)
Chloride: 107 mmol/L (ref 98–111)
Creatinine, Ser: 0.97 mg/dL (ref 0.61–1.24)
GFR, Estimated: >60 mL/min (ref >60)
Glucose, Bld: 107 mg/dL — ABNORMAL HIGH (ref 70–99)
Potassium: 3.6 mmol/L (ref 3.5–5.1)
Sodium: 139 mmol/L (ref 135–145)
Total Bilirubin: 0.7 mg/dL (ref 0.3–1.2)
Total Protein: 6.8 g/dL (ref 6.5–8.1)

## 2023-03-31 LAB — PROTIME-INR
INR: 1 (ref 0.8–1.2)
Prothrombin Time: 13.3 seconds (ref 11.4–15.2)

## 2023-03-31 LAB — CULTURE, BLOOD (ROUTINE X 2)

## 2023-03-31 LAB — RESP PANEL BY RT-PCR (RSV, FLU A&B, COVID)  RVPGX2
Influenza A by PCR: NEGATIVE
Influenza B by PCR: NEGATIVE
Resp Syncytial Virus by PCR: NEGATIVE
SARS Coronavirus 2 by RT PCR: NEGATIVE

## 2023-03-31 LAB — LACTIC ACID, PLASMA: Lactic Acid, Venous: 0.8 mmol/L (ref 0.5–1.9)

## 2023-03-31 LAB — APTT: aPTT: 31 seconds (ref 24–36)

## 2023-03-31 MED ORDER — IOHEXOL 300 MG/ML  SOLN
100.0000 mL | Freq: Once | INTRAMUSCULAR | Status: AC | PRN
  Administered 2023-03-31: 100 mL via INTRAVENOUS

## 2023-03-31 MED ORDER — SODIUM CHLORIDE (PF) 0.9 % IJ SOLN
INTRAMUSCULAR | Status: AC
  Filled 2023-03-31: qty 50

## 2023-03-31 MED ORDER — DIAZEPAM 5 MG/ML IJ SOLN
2.5000 mg | Freq: Once | INTRAMUSCULAR | Status: AC
  Administered 2023-03-31: 2.5 mg via INTRAVENOUS
  Filled 2023-03-31: qty 2

## 2023-03-31 MED ORDER — ACETAMINOPHEN 500 MG PO TABS: 1000.0000 mg | ORAL_TABLET | Freq: Four times a day (QID) | ORAL | 0 refills | Status: DC | PRN

## 2023-03-31 NOTE — Discharge Instructions (Addendum)
Your workup today was quite reassuring.  We did not find any evidence of infection specifically no evidence of pneumonia, urinary tract infection or intra-abdominal infection.  As we discussed this patient is at risk for aspiration pneumonia please monitor his symptoms and if his mental status worsens or if he worsens in general please return to the emergency room for reevaluation.  Otherwise Tylenol 1000 mg every 6 hours to treat fever.  Given his sinus congestion and overall appearance I have a very high suspicion for a viral illness.  An incidental finding of a mass on his left kidney was found it is 1.3 cm and I would discuss this finding with your primary care provider.  He may benefit from an ultrasound for further characterization.

## 2023-04-01 LAB — CULTURE, BLOOD (ROUTINE X 2): Culture: NO GROWTH

## 2023-04-02 LAB — CULTURE, BLOOD (ROUTINE X 2)

## 2023-04-03 LAB — CULTURE, BLOOD (ROUTINE X 2)

## 2023-04-04 LAB — CULTURE, BLOOD (ROUTINE X 2)

## 2023-04-05 LAB — CULTURE, BLOOD (ROUTINE X 2): Culture: NO GROWTH

## 2023-04-06 ENCOUNTER — Ambulatory Visit (INDEPENDENT_AMBULATORY_CARE_PROVIDER_SITE_OTHER): Payer: Medicare Other | Admitting: Family Medicine

## 2023-04-06 ENCOUNTER — Encounter: Payer: Self-pay | Admitting: Family Medicine

## 2023-04-06 VITALS — BP 130/62 | HR 62 | Temp 98.4°F | Ht 69.0 in | Wt 153.8 lb

## 2023-04-06 DIAGNOSIS — N2889 Other specified disorders of kidney and ureter: Secondary | ICD-10-CM | POA: Diagnosis not present

## 2023-04-06 DIAGNOSIS — I10 Essential (primary) hypertension: Secondary | ICD-10-CM | POA: Diagnosis not present

## 2023-04-06 DIAGNOSIS — E785 Hyperlipidemia, unspecified: Secondary | ICD-10-CM

## 2023-04-06 DIAGNOSIS — L03114 Cellulitis of left upper limb: Secondary | ICD-10-CM

## 2023-04-06 MED ORDER — CEPHALEXIN 500 MG PO CAPS: 500.0000 mg | ORAL_CAPSULE | Freq: Three times a day (TID) | ORAL | 0 refills | Status: AC

## 2023-04-06 NOTE — Patient Instructions (Addendum)
Keflex 3x a day for 7 days and see [place] back if fails to improve or worsens (if worsens over weekend even consider seeking care urgent care or Emergency Department)   Recommended follow up: Return for as needed for new, worsening, persistent symptoms.

## 2023-04-06 NOTE — Progress Notes (Signed)
Phone [phone] In person visit   Subjective:   Darren [name] is a 89 y.o. year old very pleasant male patient who presents for/with See problem oriented charting Chief Complaint  Patient presents with   wounds    Pt is here to have wounds assessed from fall, wounds on hand warm to touch.    Past Medical History-  Patient Active Problem List   Diagnosis Date Noted   Parkinson disease 11/27/2020    Priority: High   Essential hypertension 07/06/2021    Priority: Medium    Hyperlipidemia 11/27/2020    Priority: Medium    Gastroesophageal reflux disease without esophagitis 11/27/2020    Priority: Medium    History of CVA (cerebrovascular accident) 11/27/2020    Priority: Low    Medications- reviewed and updated Current Outpatient Medications  Medication Sig Dispense Refill   albuterol (VENTOLIN HFA) 108 (90 Base) MCG/ACT inhaler INHALE 2 PUFFS INTO THE LUNGS EVERY 6 HOURS AS NEEDED FOR WHEEZING OR SHORTNESS OF BREATH 8.5 g 2   AMBULATORY NON FORMULARY MEDICATION U step walker with laser 1 Device 0   amLODipine-benazepril (LOTREL) 10-20 MG capsule TAKE 1 CAPSULE BY MOUTH EVERY DAY 90 capsule 3   ASPIRIN LOW DOSE 81 MG tablet TAKE 1 TABLET BY MOUTH EVERY DAY 90 tablet 3   atorvastatin (LIPITOR) 40 MG tablet TAKE 1 TABLET BY MOUTH EVERY DAY 90 tablet 4   carbidopa-levodopa (SINEMET CR) 50-200 MG tablet TAKE 1 TABLET BY MOUTH AT BEDTIME 90 tablet 0   carbidopa-levodopa (SINEMET IR) 25-100 MG tablet TAKE 2 TABLETS BY MOUTH 2 TIMES DAILY morning an noon and TAKE 1 TABLET EVERY DAY AT 4PM 450 tablet 0   cephALEXin (KEFLEX) 500 MG capsule Take 1 capsule (500 mg total) by mouth 3 (three) times daily for 7 days. 21 capsule 0   D3 SUPER STRENGTH 50 MCG (2000 UT) CAPS TAKE 2 CAPSULES BY MOUTH EVERY DAY 180 capsule 3   fluticasone (FLONASE) 50 MCG/ACT nasal spray USE 2 SPRAYS in each nostril 2 TIMES DAILY 16 g 0   fluticasone-salmeterol (ADVAIR) 100-50 MCG/ACT AEPB Inhale 1 puff into the  lungs 2 (two) times daily. 1 each 11   omeprazole (PRILOSEC) 20 MG capsule TAKE 1 CAPSULE BY MOUTH EVERY DAY 90 capsule 3   QUEtiapine (SEROQUEL) 25 MG tablet 1/2-1 tablet at 4pm, 1 po q hs 180 tablet 1   acetaminophen (TYLENOL) 500 MG tablet Take 2 tablets (1,000 mg total) by mouth every 6 (six) hours as needed. 30 tablet 0   famotidine (PEPCID) 20 MG tablet Take 1 tablet (20 mg total) by mouth 2 (two) times daily. 180 tablet 3   No current facility-administered medications for this visit.     Objective:  BP 130/62   Pulse 62   Temp 98.4 F (36.9 C)   Ht 5\' 9"  (1.753 m)   Wt 153 lb 12.8 oz (69.8 kg)   SpO2 96%   BMI 22.71 kg/m  Gen: NAD, resting comfortably CV: RRR no murmurs rubs or gallops Lungs: CTAB no crackles, wheeze, rhonchi Ext: Trace to 1+ edema Skin: warm, dry, significant skin tear over dorsum of hand extending down onto the wrist-good granulation tissue without purulent drainage noted but surrounding the wound there is 2 to 2.5 cm of erythema/warmth/mild tenderness concerning for infection Neuro: Walking with walker    Assessment and Plan   # Emergency department follow-up for fall/laceration/fever S: Patient with history of Parkinson's as well as history of stroke  who presented to the hospital with complaints of weakness.  His son reported that patient's mental status and physical abilities have slowly declined over the past 3 years as anticipated but had more abrupt decline over the last 5 days prior to emergency department visit-seemed much more weak and fatigued and had several falls and seemed more confused.  He had fallen on Monday prior to emergency room department visit and hurt his left hand-had significant skin tear.  He fell on the day of emergency department visit after feeling significantly more weak and being unable to ambulate which she normally is able to do with assistance of a cane-EMS was called and found patient to be febrile-was given Tylenol then  transported to the emergency department.  They reported frequent coughing with eating food but no history of aspiration pneumonia.  Also no history of UTIs  Extensive infectious workup was started including respiratory panel, blood cultures, urinalysis with reflex to culture.  He was given a dose of ceftriaxone and azithromycin to cover for UTI or pneumonia.  He was afebrile in the emergency department after Tylenol and sepsis was thought less likely.  Meningitis was thought less likely.  Head CT showed generalized cerebral atrophy and chronic microvascular ischemia as well as a chronic right frontal lobe infarct-patient with known history of stroke.  CT of abdomen pelvis with no source of infection but incidental renal mass noted- see below.  Chest x-ray unremarkable and wrist x-ray without fracture.  EKG did not appear ischemic per ED notes.  Ultimately it was thought to possibly be viral illness and he was discharged home.  Blood cultures with no growth to date and respiratory panel came back negative.  Lactic acid Was not elevated  Today patient reports he has been doing reasonably well-no falls since that time.  Has been using his walker consistently to try to help prevent falls though.  He has not had further fever and has felt well overall but did note some worsening of the redness surrounding his wound on his left hand within last 24 to 48 hours A/P: Patient seems to be recovering well from recent emergency department visit without obvious underlying cause of infection causing his fever but at this point does appear to have developed a cellulitis without fever on his left hand around the laceration-we opted to cover with Keflex and discussed hopefully stabilizes within 24 hours and hopefully improving within 48 hours but if continues to worsen over the weekend should seek care    # Renal mass S: Renal mass from CT-"Complex lesion in the mid left kidney measuring 1.3 cm with indeterminate imaging  characteristics. Ultrasound is suggested for further characterization on nonemergent follow-up." A/P: We discussed this lesion and patient/family/son have a lot going on at the moment-we opted to reimage in 3 to 6 months-encouraged to schedule a follow-up visit around that time   #hypertension S: medication: amlodipine-benazepril 10-20 mg in the morning BP Readings from Last 3 Encounters:  04/06/23 130/62  03/31/23 (!) 159/71  01/12/23 122/66   A/P: Blood pressure is well-controlled-improved from when he was in the emergency department-likely related to stress at that time-continue current medication  # history of CVA-presented with- left arm weakness and seen neurology before moving to [place] in August 2021.  No residual weakness reported #hyperlipidemia S: Medication:atorvastatin 40 mg daily with LDL typically under 70, aspirin 81 mg started after stroke Lab Results  Component Value Date   CHOL 137 01/13/2022   HDL 56.30 01/13/2022  LDLCALC 57 01/13/2022   TRIG 118.0 01/13/2022   CHOLHDL 2 01/13/2022  A/P: Lipids were well-controlled on last check-due for repeat but we opted to wait and recheck this at follow-up in 3 to 6 months   Recommended follow up: Recommended at least 3 to 16-month follow-up-Return for as needed for new, worsening, persistent symptoms. Future Appointments  Date Time Provider Department Center  06/08/2023  2:00 PM [facility]  06/08/2023  2:40 PM Chuck Hint, MD [facility]  07/20/2023 11:15 AM Tat, Octaviano Batty, DO [facility] None   Lab/Order associations:   ICD-10-CM   1. Cellulitis of left hand  L03.114     2. Renal mass  N28.89     3. Essential hypertension  I10     4. Hyperlipidemia, unspecified hyperlipidemia type  E78.5       Meds ordered this encounter  Medications   cephALEXin (KEFLEX) 500 MG capsule    Sig: Take 1 capsule (500 mg total) by mouth 3 (three) times daily for 7 days.    Dispense:  21 capsule    Refill:  0     Return precautions advised.  Tana Conch, MD

## 2023-04-07 ENCOUNTER — Other Ambulatory Visit: Payer: Self-pay | Admitting: Neurology

## 2023-04-07 DIAGNOSIS — G20A1 Parkinson's disease without dyskinesia, without mention of fluctuations: Secondary | ICD-10-CM

## 2023-04-23 ENCOUNTER — Encounter (HOSPITAL_COMMUNITY): Payer: Self-pay

## 2023-04-23 ENCOUNTER — Emergency Department (HOSPITAL_COMMUNITY): Payer: Medicare Other

## 2023-04-23 ENCOUNTER — Inpatient Hospital Stay (HOSPITAL_COMMUNITY)
Admission: EM | Admit: 2023-04-23 | Discharge: 2023-04-27 | DRG: 482 | Disposition: A | Discharge status: Skilled Nursing Facility | Payer: Medicare Other | Source: Skilled Nursing Facility | Attending: Internal Medicine | Admitting: Internal Medicine

## 2023-04-23 DIAGNOSIS — S7222XA Displaced subtrochanteric fracture of left femur, initial encounter for closed fracture: Secondary | ICD-10-CM | POA: Diagnosis present

## 2023-04-23 DIAGNOSIS — Y92009 Unspecified place in unspecified non-institutional (private) residence as the place of occurrence of the external cause: Secondary | ICD-10-CM | POA: Diagnosis not present

## 2023-04-23 DIAGNOSIS — S72142A Displaced intertrochanteric fracture of left femur, initial encounter for closed fracture: Secondary | ICD-10-CM | POA: Diagnosis present

## 2023-04-23 DIAGNOSIS — Z87891 Personal history of nicotine dependence: Secondary | ICD-10-CM | POA: Diagnosis not present

## 2023-04-23 DIAGNOSIS — E7800 Pure hypercholesterolemia, unspecified: Secondary | ICD-10-CM | POA: Diagnosis not present

## 2023-04-23 DIAGNOSIS — F0280 Dementia in other diseases classified elsewhere without behavioral disturbance: Secondary | ICD-10-CM | POA: Diagnosis present

## 2023-04-23 DIAGNOSIS — W19XXXA Unspecified fall, initial encounter: Secondary | ICD-10-CM

## 2023-04-23 DIAGNOSIS — Z8261 Family history of arthritis: Secondary | ICD-10-CM | POA: Diagnosis not present

## 2023-04-23 DIAGNOSIS — Z8249 Family history of ischemic heart disease and other diseases of the circulatory system: Secondary | ICD-10-CM

## 2023-04-23 DIAGNOSIS — D649 Anemia, unspecified: Secondary | ICD-10-CM | POA: Diagnosis present

## 2023-04-23 DIAGNOSIS — S7292XA Unspecified fracture of left femur, initial encounter for closed fracture: Secondary | ICD-10-CM | POA: Diagnosis not present

## 2023-04-23 DIAGNOSIS — Z8673 Personal history of transient ischemic attack (TIA), and cerebral infarction without residual deficits: Secondary | ICD-10-CM

## 2023-04-23 DIAGNOSIS — G20A1 Parkinson's disease without dyskinesia, without mention of fluctuations: Secondary | ICD-10-CM | POA: Diagnosis present

## 2023-04-23 DIAGNOSIS — Z66 Do not resuscitate: Secondary | ICD-10-CM | POA: Diagnosis present

## 2023-04-23 DIAGNOSIS — I10 Essential (primary) hypertension: Secondary | ICD-10-CM | POA: Diagnosis present

## 2023-04-23 DIAGNOSIS — W010XXA Fall on same level from slipping, tripping and stumbling without subsequent striking against object, initial encounter: Secondary | ICD-10-CM | POA: Diagnosis present

## 2023-04-23 DIAGNOSIS — K219 Gastro-esophageal reflux disease without esophagitis: Secondary | ICD-10-CM | POA: Diagnosis present

## 2023-04-23 DIAGNOSIS — Z79899 Other long term (current) drug therapy: Secondary | ICD-10-CM | POA: Diagnosis not present

## 2023-04-23 DIAGNOSIS — E785 Hyperlipidemia, unspecified: Secondary | ICD-10-CM | POA: Diagnosis present

## 2023-04-23 DIAGNOSIS — J45909 Unspecified asthma, uncomplicated: Secondary | ICD-10-CM | POA: Diagnosis not present

## 2023-04-23 HISTORY — DX: Parkinson's disease without dyskinesia, without mention of fluctuations: G20.A1

## 2023-04-23 HISTORY — DX: Unspecified dementia, unspecified severity, without behavioral disturbance, psychotic disturbance, mood disturbance, and anxiety: F03.90

## 2023-04-23 LAB — CBC
HCT: 31.8 % — ABNORMAL LOW (ref 39.0–52.0)
Hemoglobin: 10.7 g/dL — ABNORMAL LOW (ref 13.0–17.0)
MCH: 31.8 pg (ref 26.0–34.0)
MCHC: 33.6 g/dL (ref 30.0–36.0)
MCV: 94.6 fL (ref 80.0–100.0)
Platelets: 229 10*3/uL (ref 150–400)
RBC: 3.36 MIL/uL — ABNORMAL LOW (ref 4.22–5.81)
RDW: 13.5 % (ref 11.5–15.5)
WBC: 7.6 10*3/uL (ref 4.0–10.5)
nRBC: 0 % (ref 0.0–0.2)

## 2023-04-23 LAB — BASIC METABOLIC PANEL
Anion gap: 10 (ref 5–15)
BUN: 29 mg/dL — ABNORMAL HIGH (ref 8–23)
CO2: 22 mmol/L (ref 22–32)
Calcium: 8.8 mg/dL — ABNORMAL LOW (ref 8.9–10.3)
Chloride: 108 mmol/L (ref 98–111)
Creatinine, Ser: 1.16 mg/dL (ref 0.61–1.24)
GFR, Estimated: >60 mL/min (ref >60)
Glucose, Bld: 120 mg/dL — ABNORMAL HIGH (ref 70–99)
Potassium: 4.4 mmol/L (ref 3.5–5.1)
Sodium: 140 mmol/L (ref 135–145)

## 2023-04-23 MED ORDER — QUETIAPINE FUMARATE 25 MG PO TABS
25.0000 mg | ORAL_TABLET | Freq: Every day | ORAL | Status: DC
  Administered 2023-04-24 – 2023-04-26 (×3): 25 mg via ORAL
  Filled 2023-04-23 (×3): qty 1

## 2023-04-23 MED ORDER — ONDANSETRON HCL 4 MG/2ML IJ SOLN
4.0000 mg | Freq: Four times a day (QID) | INTRAMUSCULAR | Status: DC | PRN
  Administered 2023-04-24: 4 mg via INTRAVENOUS
  Filled 2023-04-23: qty 2

## 2023-04-23 MED ORDER — DEXTROSE IN LACTATED RINGERS 5 % IV SOLN: INTRAVENOUS | Status: DC

## 2023-04-23 MED ORDER — CARBIDOPA-LEVODOPA ER 50-200 MG PO TBCR
1.0000 | EXTENDED_RELEASE_TABLET | Freq: Every day | ORAL | Status: DC
  Administered 2023-04-23 – 2023-04-25 (×3): 1 via ORAL
  Filled 2023-04-23 (×4): qty 1

## 2023-04-23 MED ORDER — KETOROLAC TROMETHAMINE 15 MG/ML IJ SOLN
15.0000 mg | Freq: Once | INTRAMUSCULAR | Status: AC
  Administered 2023-04-23: 15 mg via INTRAVENOUS
  Filled 2023-04-23: qty 1

## 2023-04-23 MED ORDER — MORPHINE SULFATE (PF) 2 MG/ML IV SOLN
2.0000 mg | Freq: Once | INTRAVENOUS | Status: AC
  Administered 2023-04-23: 2 mg via INTRAVENOUS
  Filled 2023-04-23: qty 1

## 2023-04-23 MED ORDER — ONDANSETRON HCL 4 MG PO TABS: 4.0000 mg | ORAL_TABLET | Freq: Four times a day (QID) | ORAL | Status: DC | PRN

## 2023-04-23 MED ORDER — MORPHINE SULFATE (PF) 2 MG/ML IV SOLN
2.0000 mg | INTRAVENOUS | Status: DC | PRN
  Administered 2023-04-24 (×3): 2 mg via INTRAVENOUS
  Filled 2023-04-23 (×3): qty 1

## 2023-04-23 NOTE — ED Provider Notes (Signed)
[place] EMERGENCY DEPARTMENT AT [facility] Provider Note   CSN: [id number] Arrival date & time: 04/23/23  1937     History {Add pertinent medical, surgical, social history, OB history to HPI:1} Chief Complaint  Patient presents with   Darren [name] is a 89 y.o. male presenting from memory care facility in the company of his daughter with concern for an unwitnessed fall and injury to his left hip.  Patient reports that he tripped or fell, landed on his left hip.  He denies any headache.  He is not on anticoagulation.  He does not have an orthopedic doctor currently according to his daughter.  He is a level 5 caveat for Parkinson's dementia.  HPI     Home Medications Prior to Admission medications   Medication Sig Start Date End Date Taking? Authorizing Provider  albuterol (VENTOLIN HFA) 108 (90 Base) MCG/ACT inhaler INHALE 2 PUFFS INTO THE LUNGS EVERY 6 HOURS AS NEEDED FOR WHEEZING OR SHORTNESS OF BREATH 08/03/21   [name], MD  AMBULATORY NON FORMULARY MEDICATION U step walker with laser 01/12/23   Tat, Octaviano Batty, DO  amLODipine-benazepril (LOTREL) 10-20 MG capsule TAKE 1 CAPSULE BY MOUTH EVERY DAY 01/19/23   [name], MD  ASPIRIN LOW DOSE 81 MG tablet TAKE 1 TABLET BY MOUTH EVERY DAY 10/19/22   [name], MD  atorvastatin (LIPITOR) 40 MG tablet TAKE 1 TABLET BY MOUTH EVERY DAY 06/22/22   [name], MD  carbidopa-levodopa (SINEMET CR) 50-200 MG tablet TAKE 1 TABLET BY MOUTH AT BEDTIME 02/09/23   Tat, Rebecca S, DO  carbidopa-levodopa (SINEMET IR) 25-100 MG tablet TAKE 2 TABLETS BY MOUTH 2 TIMES DAILY morning and noon and TAKE 1 TABLET EVERY DAY AT 4PM 04/07/23   Tat, Octaviano Batty, DO  D3 SUPER STRENGTH 50 MCG (2000 UT) CAPS TAKE 2 CAPSULES BY MOUTH EVERY DAY 10/19/22   [name], MD  fluticasone (FLONASE) 50 MCG/ACT nasal spray USE 2 SPRAYS in each nostril 2 TIMES DAILY 07/16/21   [name], MD  fluticasone-salmeterol  (ADVAIR) 100-50 MCG/ACT AEPB Inhale 1 puff into the lungs 2 (two) times daily. 07/06/21   [name], MD  omeprazole (PRILOSEC) 20 MG capsule TAKE 1 CAPSULE BY MOUTH EVERY DAY 08/16/22   [name], MD  QUEtiapine (SEROQUEL) 25 MG tablet 1/2-1 tablet at 4pm, 1 po q hs 01/12/23   Tat, Octaviano Batty, DO      Allergies    Patient has no known allergies.    Review of Systems   Review of Systems  Physical Exam Updated Vital Signs BP (!) 168/73   Pulse 96   Temp 98 F (36.7 C)   Resp 18   Ht 5\' 9"  (1.753 m)   Wt 69.7 kg   SpO2 96%   BMI 22.69 kg/m  Physical Exam Constitutional:      General: He is not in acute distress. HENT:     Head: Normocephalic and atraumatic.  Eyes:     Conjunctiva/sclera: Conjunctivae normal.     Pupils: Pupils are equal, round, and reactive to light.  Cardiovascular:     Rate and Rhythm: Normal rate and regular rhythm.  Pulmonary:     Effort: Pulmonary effort is normal. No respiratory distress.  Abdominal:     General: There is no distension.     Tenderness: There is no abdominal tenderness.  Musculoskeletal:     Comments: Shortening and  eversion of the left lower extremity, tenderness with deformity noted of the proximal femur  Skin:    General: Skin is warm and dry.  Neurological:     General: No focal deficit present.     Mental Status: He is alert. Mental status is at baseline.     ED Results / Procedures / Treatments   Labs (all labs ordered are listed, but only abnormal results are displayed) Labs Reviewed  CBC  BASIC METABOLIC PANEL    EKG None  Radiology No results found.  Procedures Procedures  {Document cardiac monitor, telemetry assessment procedure when appropriate:1}  Medications Ordered in ED Medications  ketorolac (TORADOL) 15 MG/ML injection 15 mg (has no administration in time range)  morphine (PF) 2 MG/ML injection 2 mg (has no administration in time range)    ED Course/ Medical Decision Making/ A&P    {   Click here for ABCD2, HEART and other calculatorsREFRESH Note before signing :1}                          Medical Decision Making Amount and/or Complexity of Data Reviewed Labs: ordered. Radiology: ordered.  Risk Prescription drug management.   Patient is here with suspected mechanical fall and likely pelvic or femur fracture.  He is neurovascular intact on arrival.  He reports no significant pain while at rest but with any manipulation is having significant pain in his lower extremity.  X-rays and labs are ordered.  Small dose morphine as well as Toradol were ordered for pain.  His daughter was present provide supplemental history.  There is no evidence of head trauma or additional traumatic injuries on my evaluation here.  I personally reviewed and interpreted patient's labs and imaging, notable for ***  {Document critical care time when appropriate:1} {Document review of labs and clinical decision tools ie heart score, Chads2Vasc2 etc:1}  {Document your independent review of radiology images, and any outside records:1} {Document your discussion with family members, caretakers, and with consultants:1} {Document social determinants of health affecting pt's care:1} {Document your decision making why or why not admission, treatments were needed:1} Final Clinical Impression(s) / ED Diagnoses Final diagnoses:  None    Rx / DC Orders ED Discharge Orders     None

## 2023-04-23 NOTE — Plan of Care (Signed)
  Problem: Coping: Goal: Level of anxiety will decrease Outcome: Progressing   Problem: Pain Managment: Goal: General experience of comfort will improve Outcome: Progressing   Problem: Safety: Goal: Ability to remain free from injury will improve Outcome: Progressing   

## 2023-04-23 NOTE — ED Triage Notes (Signed)
Pt has been having worsening mobility issues.  Pt has left sided hip deformity and swelling in left ankle. Pt has a hx of dementia and parkinson's and mental status is at baseline. Family suspects a fall that was unwitnessed.  Bp 150/60 Spo2 95 Rr 17  Cbg 124

## 2023-04-23 NOTE — H&P (Signed)
History and Physical    Patient: Darren [name] ZOX:[id number] DOB: 1934/06/06 DOA: 04/23/2023 DOS: the patient was seen and examined on 04/23/2023 PCP: [name], MD  Patient coming from: SNF  Chief Complaint:  Chief Complaint  Patient presents with   Fall   HPI: PASCO DENARO is a 89 y.o. male with medical history significant of Parkinson's disease, hyperlipidemia, history of CVA, GERD, essential hypertension who was brought in from skilled facility after a mechanical fall.  Patient landed on his left side and felt pain.  Did not hit his head.  Patient is not on any anticoagulants.  Patient is currently awake and alert.  Pain was initially about 8 out of 10 on the left hip.  X-ray and subsequent CT showed left comminuted intertrochanteric fracture.  Orthopedic surgery was consulted and Dr. Charlann Boxer to see patient.  Patient is being admitted to the medical service for possible surgical repair.  Review of Systems: As mentioned in the history of present illness. All other systems reviewed and are negative. Past Medical History:  Diagnosis Date   Asthma    Cataract    Stroke [facility])    Past Surgical History:  Procedure Laterality Date   APPENDECTOMY     cataract surgery- both eyes     CHOLECYSTECTOMY     Social History:  reports that he has quit smoking. His smoking use included cigarettes and pipe. He has never used smokeless tobacco. He reports that he does not currently use alcohol. He reports that he does not currently use drugs.  No Known Allergies  Family History  Problem Relation Age of Onset   Heart attack Mother        82   Heart attack Father        48   Cancer Brother        unknown type   Bipolar disorder Daughter    Arthritis Sister        back surgery and complications- ongoing antibiotics    Prior to Admission medications   Medication Sig Start Date End Date Taking? Authorizing Provider  albuterol (VENTOLIN HFA) 108 (90 Base) MCG/ACT inhaler INHALE 2 PUFFS  INTO THE LUNGS EVERY 6 HOURS AS NEEDED FOR WHEEZING OR SHORTNESS OF BREATH 08/03/21   [name], MD  AMBULATORY NON FORMULARY MEDICATION U step walker with laser 01/12/23   Tat, Octaviano Batty, DO  amLODipine-benazepril (LOTREL) 10-20 MG capsule TAKE 1 CAPSULE BY MOUTH EVERY DAY 01/19/23   [name], MD  ASPIRIN LOW DOSE 81 MG tablet TAKE 1 TABLET BY MOUTH EVERY DAY 10/19/22   [name], MD  atorvastatin (LIPITOR) 40 MG tablet TAKE 1 TABLET BY MOUTH EVERY DAY 06/22/22   [name], MD  carbidopa-levodopa (SINEMET CR) 50-200 MG tablet TAKE 1 TABLET BY MOUTH AT BEDTIME 02/09/23   Tat, Rebecca S, DO  carbidopa-levodopa (SINEMET IR) 25-100 MG tablet TAKE 2 TABLETS BY MOUTH 2 TIMES DAILY morning and noon and TAKE 1 TABLET EVERY DAY AT 4PM 04/07/23   Tat, Octaviano Batty, DO  D3 SUPER STRENGTH 50 MCG (2000 UT) CAPS TAKE 2 CAPSULES BY MOUTH EVERY DAY 10/19/22   [name], MD  fluticasone (FLONASE) 50 MCG/ACT nasal spray USE 2 SPRAYS in each nostril 2 TIMES DAILY 07/16/21   [name], MD  fluticasone-salmeterol (ADVAIR) 100-50 MCG/ACT AEPB Inhale 1 puff into the lungs 2 (two) times daily. 07/06/21   [name], MD  omeprazole (PRILOSEC) 20 MG  capsule TAKE 1 CAPSULE BY MOUTH EVERY DAY 08/16/22   [name], MD  QUEtiapine (SEROQUEL) 25 MG tablet 1/2-1 tablet at 4pm, 1 po q hs 01/12/23   Tat, Octaviano Batty, DO    Physical Exam: Vitals:   04/23/23 1946 04/23/23 1954  BP: (!) 168/73   Pulse: 96   Resp: 18   Temp: 98 F (36.7 C)   SpO2: 96%   Weight:  69.7 kg  Height:  5\' 9"  (1.753 m)   Constitutional: NAD, calm, comfortable Eyes: PERRL, lids and conjunctivae normal ENMT: Mucous membranes are moist. Posterior pharynx clear of any exudate or lesions.Normal dentition.  Neck: normal, supple, no masses, no thyromegaly Respiratory: clear to auscultation bilaterally, no wheezing, no crackles. Normal respiratory effort. No accessory muscle use.  Cardiovascular: Regular  rate and rhythm, no murmurs / rubs / gallops. No extremity edema. 2+ pedal pulses. No carotid bruits.  Abdomen: no tenderness, no masses palpated. No hepatosplenomegaly. Bowel sounds positive.  Musculoskeletal: Decreased range of motion on the left side.  Tenderness, no joint swelling or tenderness, Skin: no rashes, lesions, ulcers. No induration Neurologic: CN 2-12 grossly intact. Sensation intact, DTR normal. Strength 5/5 in all 4.  Psychiatric: Normal judgment and insight. Alert and oriented x 3. Normal mood  Data Reviewed:  Glucose 120, BUN 29 creatinine 1.16.  Calcium 8.8.  Hemoglobin is 10.7 with normal white count.  X-ray of the left hip showed acute commuted left femur IntraOp trochanteric fracture with superolateral displacement of the distal fracture segment.  Assessment and Plan:  #1 left intertrochanter fracture: Comminuted.  Patient will likely need surgical repair.  Will admit the patient.  Pain control.  Orthopedics consulted and to be seen by Ortho in the morning.  PT OT consultation.  No anticoagulation in anticipation of surgery.  N.p.o. after midnight today.  #2 status post fall: Patient has Parkinson's disease.  He therefore most likely has some gait abnormalities.  At this point we will defer to PT and OT after surgery.  #3 Parkinson's disease: Continue his home Sinemet dosing  #4 anemia: Normocytic.  Mild.  Will not require transfusion at this point.  #5 essential hypertension: Will resume home regimen as soon as P0.  #6 GERD: PPIs while in the hospital  #7 hyperlipidemia: Resume home regimen after surgery.  #8 history of CVA: No obvious residuals.    Advance Care Planning:   Code Status: Not on file full code  Consults: Dr. Charlann Boxer, orthopedic surgery  Family Communication: Daughter at bedside  Severity of Illness: The appropriate patient status for this patient is INPATIENT. Inpatient status is judged to be reasonable and necessary in order to provide the  required intensity of service to ensure the patient's safety. The patient's presenting symptoms, physical exam findings, and initial radiographic and laboratory data in the context of their chronic comorbidities is felt to place them at high risk for further clinical deterioration. Furthermore, it is not anticipated that the patient will be medically stable for discharge from the hospital within 2 midnights of admission.   * I certify that at the point of admission it is my clinical judgment that the patient will require inpatient hospital care spanning beyond 2 midnights from the point of admission due to high intensity of service, high risk for further deterioration and high frequency of surveillance required.*  AuthorLonia Blood, MD 04/23/2023 10:03 PM  For on call review [email or web address].

## 2023-04-24 ENCOUNTER — Inpatient Hospital Stay (HOSPITAL_COMMUNITY): Payer: Medicare Other | Admitting: Certified Registered Nurse Anesthetist

## 2023-04-24 ENCOUNTER — Other Ambulatory Visit: Payer: Self-pay

## 2023-04-24 ENCOUNTER — Encounter (HOSPITAL_COMMUNITY)
Admission: EM | Disposition: A | Discharge status: Skilled Nursing Facility | Payer: Self-pay | Source: Skilled Nursing Facility | Attending: Family Medicine

## 2023-04-24 ENCOUNTER — Encounter (HOSPITAL_COMMUNITY): Payer: Self-pay | Admitting: Internal Medicine

## 2023-04-24 ENCOUNTER — Inpatient Hospital Stay (HOSPITAL_COMMUNITY): Payer: Medicare Other

## 2023-04-24 DIAGNOSIS — I10 Essential (primary) hypertension: Secondary | ICD-10-CM

## 2023-04-24 DIAGNOSIS — Z87891 Personal history of nicotine dependence: Secondary | ICD-10-CM

## 2023-04-24 DIAGNOSIS — J45909 Unspecified asthma, uncomplicated: Secondary | ICD-10-CM | POA: Diagnosis not present

## 2023-04-24 DIAGNOSIS — S7222XA Displaced subtrochanteric fracture of left femur, initial encounter for closed fracture: Secondary | ICD-10-CM | POA: Diagnosis not present

## 2023-04-24 DIAGNOSIS — S72142A Displaced intertrochanteric fracture of left femur, initial encounter for closed fracture: Secondary | ICD-10-CM | POA: Diagnosis not present

## 2023-04-24 HISTORY — PX: INTRAMEDULLARY (IM) NAIL INTERTROCHANTERIC: SHX5875

## 2023-04-24 LAB — CBC
HCT: 29.3 % — ABNORMAL LOW (ref 39.0–52.0)
Hemoglobin: 9.5 g/dL — ABNORMAL LOW (ref 13.0–17.0)
MCH: 31.4 pg (ref 26.0–34.0)
MCHC: 32.4 g/dL (ref 30.0–36.0)
MCV: 96.7 fL (ref 80.0–100.0)
Platelets: 178 10*3/uL (ref 150–400)
RBC: 3.03 MIL/uL — ABNORMAL LOW (ref 4.22–5.81)
RDW: 13.5 % (ref 11.5–15.5)
WBC: 6.3 10*3/uL (ref 4.0–10.5)
nRBC: 0 % (ref 0.0–0.2)

## 2023-04-24 LAB — COMPREHENSIVE METABOLIC PANEL
ALT: 47 U/L — ABNORMAL HIGH (ref 0–44)
AST: 491 U/L — ABNORMAL HIGH (ref 15–41)
Albumin: 3.4 g/dL — ABNORMAL LOW (ref 3.5–5.0)
Alkaline Phosphatase: 109 U/L (ref 38–126)
Anion gap: 7 (ref 5–15)
BUN: 29 mg/dL — ABNORMAL HIGH (ref 8–23)
CO2: 23 mmol/L (ref 22–32)
Calcium: 8.4 mg/dL — ABNORMAL LOW (ref 8.9–10.3)
Chloride: 111 mmol/L (ref 98–111)
Creatinine, Ser: 1.1 mg/dL (ref 0.61–1.24)
GFR, Estimated: >60 mL/min (ref >60)
Glucose, Bld: 143 mg/dL — ABNORMAL HIGH (ref 70–99)
Potassium: 3.9 mmol/L (ref 3.5–5.1)
Sodium: 141 mmol/L (ref 135–145)
Total Bilirubin: 1.2 mg/dL (ref 0.3–1.2)
Total Protein: 5.7 g/dL — ABNORMAL LOW (ref 6.5–8.1)

## 2023-04-24 LAB — SURGICAL PCR SCREEN
MRSA, PCR: NEGATIVE
Staphylococcus aureus: NEGATIVE

## 2023-04-24 SURGERY — FIXATION, FRACTURE, INTERTROCHANTERIC, WITH INTRAMEDULLARY ROD
Anesthesia: Spinal | Site: Hip | Laterality: Left

## 2023-04-24 MED ORDER — POVIDONE-IODINE 10 % EX SWAB
2.0000 | Freq: Once | CUTANEOUS | Status: AC
  Administered 2023-04-24: 2 via TOPICAL

## 2023-04-24 MED ORDER — LACTATED RINGERS IV SOLN: INTRAVENOUS | Status: DC

## 2023-04-24 MED ORDER — PHENYLEPHRINE 80 MCG/ML (10ML) SYRINGE FOR IV PUSH (FOR BLOOD PRESSURE SUPPORT)
PREFILLED_SYRINGE | INTRAVENOUS | Status: DC | PRN
  Administered 2023-04-24: 80 ug via INTRAVENOUS

## 2023-04-24 MED ORDER — ASPIRIN 81 MG PO CHEW
81.0000 mg | CHEWABLE_TABLET | Freq: Two times a day (BID) | ORAL | Status: DC
  Administered 2023-04-25 – 2023-04-27 (×5): 81 mg via ORAL
  Filled 2023-04-24 (×6): qty 1

## 2023-04-24 MED ORDER — PANTOPRAZOLE SODIUM 40 MG PO TBEC
40.0000 mg | DELAYED_RELEASE_TABLET | Freq: Every day | ORAL | Status: DC
  Administered 2023-04-24 – 2023-04-26 (×3): 40 mg via ORAL
  Filled 2023-04-24 (×4): qty 1

## 2023-04-24 MED ORDER — CEFAZOLIN SODIUM-DEXTROSE 2-4 GM/100ML-% IV SOLN: 2.0000 g | INTRAVENOUS | Status: DC

## 2023-04-24 MED ORDER — PROPOFOL 10 MG/ML IV BOLUS
INTRAVENOUS | Status: AC
  Filled 2023-04-24: qty 20

## 2023-04-24 MED ORDER — ORAL CARE MOUTH RINSE: 15.0000 mL | OROMUCOSAL | Status: DC | PRN

## 2023-04-24 MED ORDER — CARBIDOPA-LEVODOPA 25-100 MG PO TABS
1.0000 | ORAL_TABLET | Freq: Every day | ORAL | Status: DC
  Administered 2023-04-25 – 2023-04-27 (×2): 1 via ORAL
  Filled 2023-04-24 (×2): qty 1

## 2023-04-24 MED ORDER — DEXMEDETOMIDINE HCL IN NACL 80 MCG/20ML IV SOLN
INTRAVENOUS | Status: AC
  Filled 2023-04-24: qty 20

## 2023-04-24 MED ORDER — 0.9 % SODIUM CHLORIDE (POUR BTL) OPTIME
TOPICAL | Status: DC | PRN
  Administered 2023-04-24: 1000 mL

## 2023-04-24 MED ORDER — DIPHENHYDRAMINE HCL 12.5 MG/5ML PO ELIX: 12.5000 mg | ORAL_SOLUTION | ORAL | Status: DC | PRN

## 2023-04-24 MED ORDER — DEXAMETHASONE SODIUM PHOSPHATE 10 MG/ML IJ SOLN
INTRAMUSCULAR | Status: AC
  Filled 2023-04-24: qty 1

## 2023-04-24 MED ORDER — TRANEXAMIC ACID-NACL 1000-0.7 MG/100ML-% IV SOLN
1000.0000 mg | Freq: Once | INTRAVENOUS | Status: AC
  Administered 2023-04-24: 1000 mg via INTRAVENOUS
  Filled 2023-04-24: qty 100

## 2023-04-24 MED ORDER — PROPOFOL 1000 MG/100ML IV EMUL
INTRAVENOUS | Status: AC
  Filled 2023-04-24: qty 100

## 2023-04-24 MED ORDER — ONDANSETRON HCL 4 MG/2ML IJ SOLN
INTRAMUSCULAR | Status: AC
  Filled 2023-04-24: qty 2

## 2023-04-24 MED ORDER — DOCUSATE SODIUM 100 MG PO CAPS
100.0000 mg | ORAL_CAPSULE | Freq: Two times a day (BID) | ORAL | Status: DC
  Administered 2023-04-24 – 2023-04-26 (×4): 100 mg via ORAL
  Filled 2023-04-24 (×5): qty 1

## 2023-04-24 MED ORDER — BUPIVACAINE HCL (PF) 0.5 % IJ SOLN
INTRAMUSCULAR | Status: DC | PRN
  Administered 2023-04-24: 12 mg via INTRATHECAL

## 2023-04-24 MED ORDER — CEFAZOLIN SODIUM-DEXTROSE 2-4 GM/100ML-% IV SOLN
2.0000 g | INTRAVENOUS | Status: AC
  Administered 2023-04-24: 2 g via INTRAVENOUS
  Filled 2023-04-24: qty 100

## 2023-04-24 MED ORDER — FENTANYL CITRATE (PF) 100 MCG/2ML IJ SOLN
INTRAMUSCULAR | Status: AC
  Filled 2023-04-24: qty 2

## 2023-04-24 MED ORDER — PHENYLEPHRINE HCL-NACL 20-0.9 MG/250ML-% IV SOLN
INTRAVENOUS | Status: AC
  Filled 2023-04-24: qty 250

## 2023-04-24 MED ORDER — DEXAMETHASONE SODIUM PHOSPHATE 10 MG/ML IJ SOLN
10.0000 mg | Freq: Once | INTRAMUSCULAR | Status: AC
  Administered 2023-04-25: 10 mg via INTRAVENOUS
  Filled 2023-04-24: qty 1

## 2023-04-24 MED ORDER — CEFAZOLIN SODIUM-DEXTROSE 2-4 GM/100ML-% IV SOLN
2.0000 g | Freq: Four times a day (QID) | INTRAVENOUS | Status: AC
  Administered 2023-04-24 – 2023-04-25 (×2): 2 g via INTRAVENOUS
  Filled 2023-04-24 (×2): qty 100

## 2023-04-24 MED ORDER — MORPHINE SULFATE (PF) 2 MG/ML IV SOLN
0.5000 mg | INTRAVENOUS | Status: DC | PRN
  Administered 2023-04-25 – 2023-04-27 (×7): 1 mg via INTRAVENOUS
  Filled 2023-04-24 (×7): qty 1

## 2023-04-24 MED ORDER — FENTANYL CITRATE PF 50 MCG/ML IJ SOSY: 25.0000 ug | PREFILLED_SYRINGE | INTRAMUSCULAR | Status: DC | PRN

## 2023-04-24 MED ORDER — PHENOL 1.4 % MT LIQD: 1.0000 | OROMUCOSAL | Status: DC | PRN

## 2023-04-24 MED ORDER — ONDANSETRON HCL 4 MG/2ML IJ SOLN: 4.0000 mg | Freq: Once | INTRAMUSCULAR | Status: DC | PRN

## 2023-04-24 MED ORDER — CHLORHEXIDINE GLUCONATE 4 % EX SOLN
60.0000 mL | Freq: Once | CUTANEOUS | Status: AC
  Administered 2023-04-24: 4 via TOPICAL
  Filled 2023-04-24: qty 15

## 2023-04-24 MED ORDER — AMLODIPINE BESYLATE 10 MG PO TABS
10.0000 mg | ORAL_TABLET | Freq: Every day | ORAL | Status: DC
  Administered 2023-04-24 – 2023-04-27 (×4): 10 mg via ORAL
  Filled 2023-04-24 (×4): qty 1

## 2023-04-24 MED ORDER — ACETAMINOPHEN 10 MG/ML IV SOLN: 1000.0000 mg | Freq: Once | INTRAVENOUS | Status: DC | PRN

## 2023-04-24 MED ORDER — MENTHOL 3 MG MT LOZG: 1.0000 | LOZENGE | OROMUCOSAL | Status: DC | PRN

## 2023-04-24 MED ORDER — ACETAMINOPHEN 325 MG PO TABS: 325.0000 mg | ORAL_TABLET | Freq: Four times a day (QID) | ORAL | Status: DC | PRN

## 2023-04-24 MED ORDER — ONDANSETRON HCL 4 MG/2ML IJ SOLN
INTRAMUSCULAR | Status: DC | PRN
  Administered 2023-04-24: 4 mg via INTRAVENOUS

## 2023-04-24 MED ORDER — LIDOCAINE HCL (PF) 2 % IJ SOLN
INTRAMUSCULAR | Status: AC
  Filled 2023-04-24: qty 5

## 2023-04-24 MED ORDER — METOCLOPRAMIDE HCL 5 MG/ML IJ SOLN: 5.0000 mg | Freq: Three times a day (TID) | INTRAMUSCULAR | Status: DC | PRN

## 2023-04-24 MED ORDER — ONDANSETRON HCL 4 MG PO TABS: 4.0000 mg | ORAL_TABLET | Freq: Four times a day (QID) | ORAL | Status: DC | PRN

## 2023-04-24 MED ORDER — POLYETHYLENE GLYCOL 3350 17 G PO PACK
17.0000 g | PACK | Freq: Two times a day (BID) | ORAL | Status: DC
  Administered 2023-04-24 – 2023-04-26 (×4): 17 g via ORAL
  Filled 2023-04-24 (×5): qty 1

## 2023-04-24 MED ORDER — SODIUM CHLORIDE 0.9 % IV SOLN: INTRAVENOUS | Status: DC

## 2023-04-24 MED ORDER — BISACODYL 10 MG RE SUPP: 10.0000 mg | Freq: Every day | RECTAL | Status: DC | PRN

## 2023-04-24 MED ORDER — METHOCARBAMOL 500 MG PO TABS: 500.0000 mg | ORAL_TABLET | Freq: Four times a day (QID) | ORAL | Status: DC | PRN

## 2023-04-24 MED ORDER — PHENYLEPHRINE HCL-NACL 20-0.9 MG/250ML-% IV SOLN
INTRAVENOUS | Status: DC | PRN
  Administered 2023-04-24: 40 ug/min via INTRAVENOUS

## 2023-04-24 MED ORDER — CARBIDOPA-LEVODOPA 25-100 MG PO TABS
2.0000 | ORAL_TABLET | Freq: Every morning | ORAL | Status: DC
  Administered 2023-04-24 – 2023-04-27 (×3): 2 via ORAL
  Filled 2023-04-24 (×3): qty 2

## 2023-04-24 MED ORDER — METHOCARBAMOL 500 MG IVPB - SIMPLE MED: 500.0000 mg | Freq: Four times a day (QID) | INTRAVENOUS | Status: DC | PRN

## 2023-04-24 MED ORDER — PROPOFOL 10 MG/ML IV BOLUS
INTRAVENOUS | Status: DC | PRN
  Administered 2023-04-24 (×5): 10 mg via INTRAVENOUS
  Administered 2023-04-24: 20 mg via INTRAVENOUS

## 2023-04-24 MED ORDER — METOCLOPRAMIDE HCL 5 MG PO TABS: 5.0000 mg | ORAL_TABLET | Freq: Three times a day (TID) | ORAL | Status: DC | PRN

## 2023-04-24 MED ORDER — FENTANYL CITRATE (PF) 100 MCG/2ML IJ SOLN
INTRAMUSCULAR | Status: DC | PRN
  Administered 2023-04-24 (×2): 25 ug via INTRAVENOUS

## 2023-04-24 MED ORDER — DEXAMETHASONE SODIUM PHOSPHATE 10 MG/ML IJ SOLN
INTRAMUSCULAR | Status: DC | PRN
  Administered 2023-04-24: 8 mg via INTRAVENOUS

## 2023-04-24 MED ORDER — TRANEXAMIC ACID-NACL 1000-0.7 MG/100ML-% IV SOLN
1000.0000 mg | INTRAVENOUS | Status: AC
  Administered 2023-04-24: 1000 mg via INTRAVENOUS
  Filled 2023-04-24: qty 100

## 2023-04-24 MED ORDER — PROPOFOL 500 MG/50ML IV EMUL
INTRAVENOUS | Status: DC | PRN
  Administered 2023-04-24: 50 ug/kg/min via INTRAVENOUS

## 2023-04-24 MED ORDER — ONDANSETRON HCL 4 MG/2ML IJ SOLN: 4.0000 mg | Freq: Four times a day (QID) | INTRAMUSCULAR | Status: DC | PRN

## 2023-04-24 SURGICAL SUPPLY — 39 items
ADH SKN CLS APL DERMABOND .7 (GAUZE/BANDAGES/DRESSINGS) ×1
BAG COUNTER SPONGE SURGICOUNT (BAG) IMPLANT
BAG SPEC THK2 15X12 ZIP CLS (MISCELLANEOUS) ×1
BAG SPNG CNTER NS LX DISP (BAG)
BAG ZIPLOCK 12X15 (MISCELLANEOUS) ×1 IMPLANT
BIT DRILL CANN LG 4.3MM (BIT) IMPLANT
COVER PERINEAL POST (MISCELLANEOUS) ×1 IMPLANT
COVER SURGICAL LIGHT HANDLE (MISCELLANEOUS) ×1 IMPLANT
DERMABOND ADVANCED .7 DNX12 (GAUZE/BANDAGES/DRESSINGS) ×1 IMPLANT
DRILL BIT CANN LG 4.3MM (BIT) ×1
DRSG AQUACEL AG ADV 3.5X 4 (GAUZE/BANDAGES/DRESSINGS) IMPLANT
DRSG AQUACEL AG ADV 3.5X 6 (GAUZE/BANDAGES/DRESSINGS) ×2 IMPLANT
DURAPREP 26ML APPLICATOR (WOUND CARE) ×1 IMPLANT
ELECT REM PT RETURN 15FT ADLT (MISCELLANEOUS) ×1 IMPLANT
FACESHIELD WRAPAROUND (MASK) ×2 IMPLANT
FACESHIELD WRAPAROUND OR TEAM (MASK) ×2 IMPLANT
GLOVE BIO SURGEON STRL SZ 6.5 (GLOVE) ×1 IMPLANT
GLOVE BIOGEL PI IND STRL 6.5 (GLOVE) ×1 IMPLANT
GLOVE BIOGEL PI IND STRL 7.5 (GLOVE) ×2 IMPLANT
GLOVE PI ORTHO PRO STRL 7.5 (GLOVE) ×1 IMPLANT
GOWN STRL REUS W/ TWL LRG LVL3 (GOWN DISPOSABLE) ×2 IMPLANT
GOWN STRL REUS W/TWL LRG LVL3 (GOWN DISPOSABLE) ×2
GUIDEPIN VERSANAIL DSP 3.2X444 (ORTHOPEDIC DISPOSABLE SUPPLIES) IMPLANT
KIT BASIN OR (CUSTOM PROCEDURE TRAY) ×1 IMPLANT
KIT TURNOVER KIT A (KITS) IMPLANT
MANIFOLD NEPTUNE II (INSTRUMENTS) ×1 IMPLANT
NAIL HIP FRACT 130D 11X180 (Screw) IMPLANT
PACK GENERAL/GYN (CUSTOM PROCEDURE TRAY) ×1 IMPLANT
PADDING CAST COTTON 6X4 STRL (CAST SUPPLIES) ×1 IMPLANT
PROTECTOR NERVE ULNAR (MISCELLANEOUS) ×1 IMPLANT
SCREW BONE CORTICAL 5.0X42 (Screw) IMPLANT
SCREW LAG 10.5MMX105MM HFN (Screw) IMPLANT
SUT MNCRL AB 4-0 PS2 18 (SUTURE) ×1 IMPLANT
SUT VIC AB 1 CT1 27 (SUTURE) ×1
SUT VIC AB 1 CT1 27XBRD ANBCTR (SUTURE) ×1 IMPLANT
SUT VIC AB 2-0 CT1 27 (SUTURE) ×2
SUT VIC AB 2-0 CT1 TAPERPNT 27 (SUTURE) ×2 IMPLANT
TOWEL OR 17X26 10 PK STRL BLUE (TOWEL DISPOSABLE) ×1 IMPLANT
TOWEL OR NON WOVEN STRL DISP B (DISPOSABLE) ×1 IMPLANT

## 2023-04-24 NOTE — Interval H&P Note (Signed)
History and Physical Interval Note:  04/24/2023 4:53 PM  Darren [name]  has presented today for surgery, with the diagnosis of Left intertrochanteric hip fracture.  The various methods of treatment have been discussed with the patient and family. After consideration of risks, benefits and other options for treatment, the patient has consented to  Procedure(s) with comments: INTRAMEDULLARY (IM) NAIL INTERTROCHANTERIC (Left) - Biomet as a surgical intervention.  The patient's history has been reviewed, patient examined, no change in status, stable for surgery.  I have reviewed the patient's chart and labs.  Questions were answered to the patient's satisfaction.     [name]

## 2023-04-24 NOTE — Consult Note (Signed)
Reason for Consult:left hip fracture Referring Physician: Grunz, MD  Darren [name] is an 89 y.o. male.  HPI: Darren [name] is a 89 y.o. male with medical history significant of Parkinson's disease, advanced dementia, hyperlipidemia, history of CVA, GERD, essential hypertension who was brought in from home.  His family has arranged for home care.  One of his care givers is present with him in the room this am. Patient landed on his left side and felt pain.  Did not hit his head.  Patient is not on any anticoagulants.  Patient is currently awake and alert.  Pain was initially about 8 out of 10 on the left hip.  X-ray and subsequent CT showed left comminuted intertrochanteric fracture.  Orthopedic surgery was consulted to manage his fracture.  Of note his wife was recently discharged from hospital to SNF to recover after illness.  Past Medical History:  Diagnosis Date   Asthma    Cataract    Stroke (HCC)     Past Surgical History:  Procedure Laterality Date   APPENDECTOMY     cataract surgery- both eyes     CHOLECYSTECTOMY      Family History  Problem Relation Age of Onset   Heart attack Mother        97   Heart attack Father        65   Cancer Brother        unknown type   Bipolar disorder Daughter    Arthritis Sister        back surgery and complications- ongoing antibiotics    Social History:  reports that he has quit smoking. His smoking use included cigarettes and pipe. He has never used smokeless tobacco. He reports that he does not currently use alcohol. He reports that he does not currently use drugs.  Allergies: No Known Allergies  Medications: I have reviewed the patient's current medications. Scheduled:  carbidopa-levodopa  1 tablet Oral QHS   QUEtiapine  25 mg Oral QHS    Results for orders placed or performed during the hospital encounter of 04/23/23 (from the past 24 hour(s))  CBC     Status: Abnormal   Collection Time: 04/23/23  8:35 PM  Result Value Ref  Range   WBC 7.6 4.0 - 10.5 K/uL   RBC 3.36 (L) 4.22 - 5.81 MIL/uL   Hemoglobin 10.7 (L) 13.0 - 17.0 g/dL   HCT 31.8 (L) 39.0 - 52.0 %   MCV 94.6 80.0 - 100.0 fL   MCH 31.8 26.0 - 34.0 pg   MCHC 33.6 30.0 - 36.0 g/dL   RDW 13.5 11.5 - 15.5 %   Platelets 229 150 - 400 K/uL   nRBC 0.0 0.0 - 0.2 %  Basic metabolic panel     Status: Abnormal   Collection Time: 04/23/23  8:35 PM  Result Value Ref Range   Sodium 140 135 - 145 mmol/L   Potassium 4.4 3.5 - 5.1 mmol/L   Chloride 108 98 - 111 mmol/L   CO2 22 22 - 32 mmol/L   Glucose, Bld 120 (H) 70 - 99 mg/dL   BUN 29 (H) 8 - 23 mg/dL   Creatinine, Ser 1.16 0.61 - 1.24 mg/dL   Calcium 8.8 (L) 8.9 - 10.3 mg/dL   GFR, Estimated >60 >60 mL/min   Anion gap 10 5 - 15  Surgical pcr screen     Status: None   Collection Time: 04/23/23 11:10 PM   Specimen: Nasal Mucosa; Nasal Swab    Result Value Ref Range   MRSA, PCR NEGATIVE NEGATIVE   Staphylococcus aureus NEGATIVE NEGATIVE  CBC     Status: Abnormal   Collection Time: 04/24/23  4:01 AM  Result Value Ref Range   WBC 6.3 4.0 - 10.5 K/uL   RBC 3.03 (L) 4.22 - 5.81 MIL/uL   Hemoglobin 9.5 (L) 13.0 - 17.0 g/dL   HCT 29.3 (L) 39.0 - 52.0 %   MCV 96.7 80.0 - 100.0 fL   MCH 31.4 26.0 - 34.0 pg   MCHC 32.4 30.0 - 36.0 g/dL   RDW 13.5 11.5 - 15.5 %   Platelets 178 150 - 400 K/uL   nRBC 0.0 0.0 - 0.2 %  Comprehensive metabolic panel     Status: Abnormal   Collection Time: 04/24/23  4:01 AM  Result Value Ref Range   Sodium 141 135 - 145 mmol/L   Potassium 3.9 3.5 - 5.1 mmol/L   Chloride 111 98 - 111 mmol/L   CO2 23 22 - 32 mmol/L   Glucose, Bld 143 (H) 70 - 99 mg/dL   BUN 29 (H) 8 - 23 mg/dL   Creatinine, Ser 1.10 0.61 - 1.24 mg/dL   Calcium 8.4 (L) 8.9 - 10.3 mg/dL   Total Protein 5.7 (L) 6.5 - 8.1 g/dL   Albumin 3.4 (L) 3.5 - 5.0 g/dL   AST 491 (H) 15 - 41 U/L   ALT 47 (H) 0 - 44 U/L   Alkaline Phosphatase 109 38 - 126 U/L   Total Bilirubin 1.2 0.3 - 1.2 mg/dL   GFR, Estimated >60 >60  mL/min   Anion gap 7 5 - 15     X-ray: CLINICAL DATA:  Fall   EXAM: DG HIP (WITH OR WITHOUT PELVIS) 2-3V LEFT; LEFT FEMUR 2 VIEWS   COMPARISON:  None Available.   FINDINGS: Left hip: There is an acute comminuted left femoral intratrochanteric fracture. There is superolateral displacement of the distal fracture fragment. There is no dislocation. Surgical clips overlie the right pelvis.   Left femur: There are no additional fractures of the left femur. There is no dislocation. Soft tissues are within normal limits.   IMPRESSION: Acute comminuted left femoral intratrochanteric fracture with superolateral displacement of the distal fracture fragment.     Electronically Signed   By: Amy  Guttmann M.D.  ROS As per HPI  Blood pressure (!) 159/64, pulse 70, temperature 98.8 F (37.1 C), temperature source Oral, resp. rate 17, height 5' 9" (1.753 m), weight 69.7 kg, SpO2 97 %.  Physical Exam: Constitutional: NAD, calm, comfortable Eyes: PERRL, lids and conjunctivae normal ENMT: Mucous membranes are moist. Posterior pharynx clear of any exudate or lesions.Normal dentition.  Neck: normal, supple, no masses, no thyromegaly Respiratory: clear to auscultation bilaterally, no wheezing, no crackles. Normal respiratory effort. No accessory muscle use.  Cardiovascular: Regular rate and rhythm, no murmurs / rubs / gallops. No extremity edema. 2+ pedal pulses. No carotid bruits.  Abdomen: no tenderness, no masses palpated. No hepatosplenomegaly. Bowel sounds positive.  Musculoskeletal: Decreased range of motion on the left side.  Tenderness, no joint swelling or tenderness. LLE contracted and externally rotated Skin: no rashes, lesions, ulcers. No induration Neurologic: Dementia Psychiatric: Demented  Assessment/Plan: Left intertrochanteric hip fracture  Plan: For at least palliative if not functional standpoint fixation of fracture would be indicated unless otherwise prohibitive or  decided differently by family NPO now for hopeful OR time today versus tomorrow I will contact family to review  Darren [name]   04/24/2023, 6:58 AM      

## 2023-04-24 NOTE — Anesthesia Postprocedure Evaluation (Signed)
Anesthesia Post Note  Patient: Darren [name]  Procedure(s) Performed: INTRAMEDULLARY (IM) NAIL INTERTROCHANTERIC (Left: Hip)     Patient location during evaluation: Nursing Unit Anesthesia Type: Spinal Level of consciousness: oriented and awake and alert Pain management: pain level controlled Vital Signs Assessment: post-procedure vital signs reviewed and stable Respiratory status: spontaneous breathing and respiratory function stable Cardiovascular status: blood pressure returned to baseline and stable Postop Assessment: no headache, no backache, no apparent nausea or vomiting and patient able to bend at knees Anesthetic complications: no   No notable events documented.  Last Vitals:  Vitals:   04/24/23 1938 04/24/23 1955  BP: (!) 151/88 (!) 154/75  Pulse:  81  Resp: 11 16  Temp: (!) 36.4 C (!) 36.4 C  SpO2: 98% 98%    Last Pain:  Vitals:   04/24/23 1955  TempSrc: Oral  PainSc:                  Trevor Iha

## 2023-04-24 NOTE — Anesthesia Preprocedure Evaluation (Signed)
Anesthesia Evaluation  Patient identified by MRN, date of birth, ID band Patient awake    Reviewed: Allergy & Precautions, NPO status , Patient's Chart, lab work & pertinent test results  Airway Mallampati: II  TM Distance: >3 FB Neck ROM: Full    Dental no notable dental hx. (+) Teeth Intact, Dental Advisory Given   Pulmonary asthma , former smoker   Pulmonary exam normal breath sounds clear to auscultation       Cardiovascular hypertension, Normal cardiovascular exam Rhythm:Regular Rate:Normal     Neuro/Psych  PSYCHIATRIC DISORDERS     Dementia  Neuromuscular disease (hx of parkinsons) CVA, No Residual Symptoms    GI/Hepatic ,GERD  ,,  Endo/Other    Renal/GU Lab Results      Component                Value               Date                      CREATININE               1.10                04/24/2023                BUN                      29 (H)              04/24/2023               K                        3.9                 04/24/2023                  Musculoskeletal negative musculoskeletal ROS (+)    Abdominal   Peds  Hematology  (+) Blood dyscrasia, anemia Lab Results      Component                Value               Date                      WBC                      6.3                 04/24/2023                HGB                      9.5 (L)             04/24/2023                HCT                      29.3 (L)            04/24/2023                PLT  178                 04/24/2023              Anesthesia Other Findings NKDA  Reproductive/Obstetrics                              Anesthesia Physical Anesthesia Plan  ASA: 3  Anesthesia Plan: Spinal   Post-op Pain Management: Regional block*   Induction:   PONV Risk Score and Plan: Treatment may vary due to age or medical condition  Airway Management Planned: Natural Airway, Nasal Cannula and  Simple Face Mask  Additional Equipment: None  Intra-op Plan:   Post-operative Plan:   Informed Consent: I have reviewed the patients History and Physical, chart, labs and discussed the procedure including the risks, benefits and alternatives for the proposed anesthesia with the patient or authorized representative who has indicated his/her understanding and acceptance.     Dental advisory given  Plan Discussed with:   Anesthesia Plan Comments: [facility])         Anesthesia Quick Evaluation

## 2023-04-24 NOTE — Anesthesia Procedure Notes (Signed)
Spinal  Patient location during procedure: OR Start time: 04/24/2023 5:14 PM End time: 04/24/2023 5:18 PM Reason for block: surgical anesthesia Staffing Performed: anesthesiologist  Anesthesiologist: Trevor Iha, MD Performed by: Trevor Iha, MD Authorized by: Trevor Iha, MD   Preanesthetic Checklist Completed: patient identified, IV checked, risks and benefits discussed, surgical consent, monitors and equipment checked, pre-op evaluation and timeout performed Spinal Block Patient position: right lateral decubitus Prep: DuraPrep and site prepped and draped Patient monitoring: heart rate, cardiac monitor, continuous pulse ox and blood pressure Approach: midline Location: L3-4 Injection technique: single-shot Needle Needle type: Pencan  Needle gauge: 24 G Needle length: 10 cm Needle insertion depth: 6 cm Assessment Sensory level: T4 Events: CSF return Additional Notes 1 Attempt (s). Pt tolerated procedure well.

## 2023-04-24 NOTE — Op Note (Unsigned)
NAME: ZEPPLIN, MISAK MEDICAL RECORD NO: [id number] ACCOUNT NO: [id number] DATE OF BIRTH: Feb 23, 1934 FACILITY: [facility] LOCATION: [id number] PHYSICIAN: Madlyn Frankel. Charlann Boxer, MD  Operative Report   DATE OF PROCEDURE: 04/24/2023  PREOPERATIVE DIAGNOSIS:  Comminuted left intertrochanteric femur fracture.  POSTOPERATIVE DIAGNOSIS:  Comminuted left intertrochanteric femur fracture.  PROCEDURE:  Closed reduction, intramedullary nailing of left intertrochanteric femur fracture utilizing Biomet Zimmer nail 11 x 180 mm with a 130-degree lag screw and a distal interlock.  SURGEON:  Madlyn Frankel. Charlann Boxer, MD  ASSISTANT:  Rosalene Billings, PA-C.  Note that Ms. Domenic Schwab was present for the entirety of the case from preoperative positioning, perioperative management of the operative extremity, general facilitation of the case and primary wound closure.  ANESTHESIA:  Spinal.  BLOOD LOSS:  About 100 mL  COMPLICATIONS:  None.  DRAINS:  None.  INDICATIONS FOR PROCEDURE:  The patient is an 89 year old male with history of Parkinson's disease as well as dementia in addition to other medical comorbidities.  He is currently cared for at home by his family and caregivers.  He had a ground level  fall with inability to bear weight and significant pain.  He was brought to the [facility] Emergency Room where radiographs revealed a comminuted displaced left intertrochanteric femur fracture.  He was admitted to the hospitalist services and  orthopedics was consulted for management.  The necessity of the procedure was discussed in terms of palliative pain control as well as to allow for mobility.  The risks of nonunion and malunion reviewed.  Risks of infection, DVT.  Consent was obtained  for management of the fracture.  PROCEDURE IN DETAIL:  The patient was brought to the operative theater.  Once adequate anesthesia, preoperative antibiotics, Ancef and tranexamic acid administered, he was positioned supine on the Hana table.  All  bony prominences were carefully padded,  particularly the right unaffected extremity, which was flexed and abducted out of the way with bony prominences padded over the lateral aspect of the knee to protect the peroneal nerve.  Once positioned, traction and internal rotation was applied.   Fluoroscopy was used to confirm the orientation of the fracture segment.  There was some further displacement of the medial aspect of his calcar that I was unable to get fully reduced anatomically.  However, his neck angle as well as the superior aspect  of his neck in intertroch segment appeared to be anatomically aligned up.  It also looked anatomic on his lateral view.  Given these findings, we prepped and draped the lateral aspect of left hip with a shower curtain technique.  A timeout was performed  identifying the patient, planned procedure, and extremity.  Fluoroscopy was brought back to the field.  Landmarks were identified.  An incision was then made proximal to the lateral aspect of the trochanter.  Soft tissue dissection was carried through  the gluteal fascia.  A guidewire was then inserted into the tip of the trochanter and passed across the fracture site, confirmed radiographically.  I then opened up the proximal femur with a starting drill.  We then passed an 11 x 180 mm nail by hand to  its appropriate depth.  Then, using the insertion jig, the guidewire was then inserted into the center of the femoral head on the AP view and slightly posterior on the lateral view.  I measured and selected a 105 mm lag screw.  We drilled for this and  then passed the lag screw.  With the lag screw in  position, traction was let off the lower extremity and I used the compression wheel to medialize the shaft to the fracture site.  Once this was completed, I tightened the locking bolt proximally and then  backed it off a quarter of a turn to allow for further compression.  At this point, a distal interlock was placed through  the insertion jig.  Once this was done, the jig was removed.  Final radiographs were obtained in AP and lateral plane.  Wounds were  all irrigated with normal saline solution.  The wounds were then closed in layers.  We used 2-0 Vicryl and a Monocryl stitch on the skin.  The skin wounds were clean, dry and dressed sterilely using surgical glue and Aquacel dressing.  The patient was  then brought to the recovery room in stable condition, tolerating the procedure well.  Postoperatively, based on his dementia, we will state that he will be weightbearing as tolerated to allow for ease of transfers.  He will be in the hospital as a work to assess his disposition and capabilities to manage him at home.   VAI D: 04/24/2023 6:03:14 pm T: 04/24/2023 8:40:00 pm  JOB: [id number]/ [id number]

## 2023-04-24 NOTE — Transfer of Care (Signed)
Immediate Anesthesia Transfer of Care Note  Patient: Darren [name]  Procedure(s) Performed: INTRAMEDULLARY (IM) NAIL INTERTROCHANTERIC (Left: Hip)  Patient Location: PACU  Anesthesia Type:Spinal  Level of Consciousness: drowsy and patient cooperative  Airway & Oxygen Therapy: Patient Spontanous Breathing and Patient connected to face mask oxygen  Post-op Assessment: Report given to RN and Post -op Vital signs reviewed and stable  Post vital signs: Reviewed and stable  Last Vitals:  Vitals Value Taken Time  BP    Temp    Pulse    Resp 10 04/24/23 1813  SpO2    Vitals shown include unvalidated device data.  Last Pain:  Vitals:   04/24/23 1517  TempSrc: Oral  PainSc: 0-No pain         Complications: No notable events documented.

## 2023-04-24 NOTE — TOC Initial Note (Signed)
Transition of Care [facility]) - Initial/Assessment Note   Patient Details  Name: Darren [name] MRN: [id number] Date of Birth: Nov 27, 1933  [facility]) CM/SW Contact:    Ewing Schlein, LCSW Phone Number: 04/24/2023, 10:20 AM  Clinical Narrative: CSW spoke with son, Richerd Vanduzer, to complete assessment. Per son, patient lives in independent living at [facility] with 24/7 caregivers as patient is on the waitlist for memory care at the facility. Patient's wife is currently at [facility] for SNF and family is requesting that patient go to [facility] for rehab at discharge. TOC awaiting surgery and PT evaluation.                Expected Discharge Plan: Skilled Nursing Facility Barriers to Discharge: Continued Medical Work up  Patient Goals and CMS Choice Patient states their goals for this hospitalization and ongoing recovery are:: Go to rehab before returning to [employer] [place] CMS Medicare.gov Compare Post Acute Care list provided to:: Patient Represenative (must comment) Choice offered to / list presented to : Adult Children Chief Financial Officer (spouse))  Expected Discharge Plan and Services In-house Referral: Clinical Social Work Post Acute Care Choice: Skilled Nursing Facility Living arrangements for the past 2 months: Independent Living Facility  Prior Living Arrangements/Services Living arrangements for the past 2 months: Independent Living Facility Lives with:: Spouse Patient language and need for interpreter reviewed:: Yes Do you feel safe going back to the place where you live?: Yes      Need for Family Participation in Patient Care: Yes (Comment) (Patient has dementia.) Care giver support system in place?: Yes (comment) Criminal Activity/Legal Involvement Pertinent to Current Situation/Hospitalization: No - Comment as needed  Activities of Daily Living Home Assistive Devices/Equipment: Cane (specify quad or straight) ADL Screening (condition at time of admission) Patient's  cognitive ability adequate to safely complete daily activities?: No Is the patient deaf or have difficulty hearing?: No Does the patient have difficulty seeing, even when wearing glasses/contacts?: No Does the patient have difficulty concentrating, remembering, or making decisions?: Yes Patient able to express need for assistance with ADLs?: No Does the patient have difficulty dressing or bathing?: Yes Independently performs ADLs?: No Communication: Needs assistance (dementia) Is this a change from baseline?: Pre-admission baseline Dressing (OT): Needs assistance Is this a change from baseline?: Pre-admission baseline Grooming: Needs assistance Is this a change from baseline?: Pre-admission baseline Feeding: Needs assistance Is this a change from baseline?: Pre-admission baseline Bathing: Needs assistance Is this a change from baseline?: Pre-admission baseline Toileting: Needs assistance (incontinent) Is this a change from baseline?: Pre-admission baseline In/Out Bed: Needs assistance Is this a change from baseline?: Pre-admission baseline Walks in Home: Needs assistance (uses cane but has been very weak and falling a lot for past few months) Is this a change from baseline?: Pre-admission baseline Does the patient have difficulty walking or climbing stairs?: Yes Weakness of Legs: Both Weakness of Arms/Hands: Both  Permission Sought/Granted Permission sought to share information with : Oceanographer granted to share information with : Yes, Verbal Permission Granted Permission granted to share info w AGENCY: SNFs  Emotional Assessment Orientation: : Oriented to Self Alcohol / Substance Use: Not Applicable Psych Involvement: No (comment)  Admission diagnosis:  Closed left subtrochanteric femur fracture, initial encounter (HCC) [S72.22XA] Closed fracture of left femur, unspecified fracture morphology, unspecified portion of femur, initial encounter [facility])  [S72.92XA] Patient Active Problem List   Diagnosis Date Noted   Closed left subtrochanteric femur fracture, initial encounter (HCC) 04/23/2023  Fall at home, initial encounter 04/23/2023   Essential hypertension 07/06/2021   Parkinson disease 11/27/2020   Hyperlipidemia 11/27/2020   History of CVA (cerebrovascular accident) 11/27/2020   Gastroesophageal reflux disease without esophagitis 11/27/2020   PCP:  [name], MD Pharmacy:   [facility] [place], [place] - [address] [place] [address] Phone: [phone] Fax: [phone]  Social Determinants of Health (SDOH) Social History: SDOH Screenings   Food Insecurity: Patient Unable To Answer (04/23/2023)  Housing: Patient Unable To Answer (04/05/2023)  Transportation Needs: No Transportation Needs (04/05/2023)  Depression (PHQ2-9): Low Risk  (04/06/2023)  Financial Resource Strain: Low Risk  (04/05/2023)  Physical Activity: Unknown (04/05/2023)  Social Connections: Moderately Isolated (04/05/2023)  Stress: Stress Concern Present (04/05/2023)  Tobacco Use: Medium Risk (04/23/2023)   SDOH Interventions:    Readmission Risk Interventions     No data to display

## 2023-04-24 NOTE — Brief Op Note (Signed)
04/23/2023 - 04/24/2023  5:00 PM  PATIENT:  Darren [name]  89 y.o. male  PRE-OPERATIVE DIAGNOSIS:  Left intertrochanteric hip fracture  POST-OPERATIVE DIAGNOSIS:  Left intertrochanteric hip fracture  PROCEDURE:  Procedure(s) with comments: INTRAMEDULLARY (IM) NAIL INTERTROCHANTERIC (Left) - Biomet  SURGEON:  Surgeon(s) and Role:    Durene Romans, MD - Primary  PHYSICIAN ASSISTANT: Rosalene Billings, PA-C  ANESTHESIA:   spinal  EBL:  100 cc  BLOOD ADMINISTERED:none  DRAINS: none   LOCAL MEDICATIONS USED:  NONE  SPECIMEN:  No Specimen  DISPOSITION OF SPECIMEN:  N/A  COUNTS:  YES  TOURNIQUET:  * No tourniquets in log *  DICTATION: .Other Dictation: Dictation Number [id number]  PLAN OF CARE: Admit to inpatient   PATIENT DISPOSITION:  PACU - hemodynamically stable.   Delay start of Pharmacological VTE agent (>24hrs) due to surgical blood loss or risk of bleeding: no

## 2023-04-24 NOTE — H&P (View-Only) (Signed)
Reason for Consult:left hip fracture Referring Physician: Jarvis Newcomer, MD  Darren [name] is an 89 y.o. male.  HPI: Darren [name] is a 89 y.o. male with medical history significant of Parkinson's disease, advanced dementia, hyperlipidemia, history of CVA, GERD, essential hypertension who was brought in from home.  His family has arranged for home care.  One of his care givers is present with him in the room this am. Patient landed on his left side and felt pain.  Did not hit his head.  Patient is not on any anticoagulants.  Patient is currently awake and alert.  Pain was initially about 8 out of 10 on the left hip.  X-ray and subsequent CT showed left comminuted intertrochanteric fracture.  Orthopedic surgery was consulted to manage his fracture.  Of note his wife was recently discharged from hospital to SNF to recover after illness.  Past Medical History:  Diagnosis Date   Asthma    Cataract    Stroke [facility])     Past Surgical History:  Procedure Laterality Date   APPENDECTOMY     cataract surgery- both eyes     CHOLECYSTECTOMY      Family History  Problem Relation Age of Onset   Heart attack Mother        15   Heart attack Father        7   Cancer Brother        unknown type   Bipolar disorder Daughter    Arthritis Sister        back surgery and complications- ongoing antibiotics    Social History:  reports that he has quit smoking. His smoking use included cigarettes and pipe. He has never used smokeless tobacco. He reports that he does not currently use alcohol. He reports that he does not currently use drugs.  Allergies: No Known Allergies  Medications: I have reviewed the patient's current medications. Scheduled:  carbidopa-levodopa  1 tablet Oral QHS   QUEtiapine  25 mg Oral QHS    Results for orders placed or performed during the hospital encounter of 04/23/23 (from the past 24 hour(s))  CBC     Status: Abnormal   Collection Time: 04/23/23  8:35 PM  Result Value Ref  Range   WBC 7.6 4.0 - 10.5 K/uL   RBC 3.36 (L) 4.22 - 5.81 MIL/uL   Hemoglobin 10.7 (L) 13.0 - 17.0 g/dL   HCT 16.1 (L) 09.6 - 04.5 %   MCV 94.6 80.0 - 100.0 fL   MCH 31.8 26.0 - 34.0 pg   MCHC 33.6 30.0 - 36.0 g/dL   RDW 40.9 81.1 - 91.4 %   Platelets 229 150 - 400 K/uL   nRBC 0.0 0.0 - 0.2 %  Basic metabolic panel     Status: Abnormal   Collection Time: 04/23/23  8:35 PM  Result Value Ref Range   Sodium 140 135 - 145 mmol/L   Potassium 4.4 3.5 - 5.1 mmol/L   Chloride 108 98 - 111 mmol/L   CO2 22 22 - 32 mmol/L   Glucose, Bld 120 (H) 70 - 99 mg/dL   BUN 29 (H) 8 - 23 mg/dL   Creatinine, Ser 7.82 0.61 - 1.24 mg/dL   Calcium 8.8 (L) 8.9 - 10.3 mg/dL   GFR, Estimated >95 >62 mL/min   Anion gap 10 5 - 15  Surgical pcr screen     Status: None   Collection Time: 04/23/23 11:10 PM   Specimen: Nasal Mucosa; Nasal Swab  Result Value Ref Range   MRSA, PCR NEGATIVE NEGATIVE   Staphylococcus aureus NEGATIVE NEGATIVE  CBC     Status: Abnormal   Collection Time: 04/24/23  4:01 AM  Result Value Ref Range   WBC 6.3 4.0 - 10.5 K/uL   RBC 3.03 (L) 4.22 - 5.81 MIL/uL   Hemoglobin 9.5 (L) 13.0 - 17.0 g/dL   HCT 16.1 (L) 09.6 - 04.5 %   MCV 96.7 80.0 - 100.0 fL   MCH 31.4 26.0 - 34.0 pg   MCHC 32.4 30.0 - 36.0 g/dL   RDW 40.9 81.1 - 91.4 %   Platelets 178 150 - 400 K/uL   nRBC 0.0 0.0 - 0.2 %  Comprehensive metabolic panel     Status: Abnormal   Collection Time: 04/24/23  4:01 AM  Result Value Ref Range   Sodium 141 135 - 145 mmol/L   Potassium 3.9 3.5 - 5.1 mmol/L   Chloride 111 98 - 111 mmol/L   CO2 23 22 - 32 mmol/L   Glucose, Bld 143 (H) 70 - 99 mg/dL   BUN 29 (H) 8 - 23 mg/dL   Creatinine, Ser 7.82 0.61 - 1.24 mg/dL   Calcium 8.4 (L) 8.9 - 10.3 mg/dL   Total Protein 5.7 (L) 6.5 - 8.1 g/dL   Albumin 3.4 (L) 3.5 - 5.0 g/dL   AST 956 (H) 15 - 41 U/L   ALT 47 (H) 0 - 44 U/L   Alkaline Phosphatase 109 38 - 126 U/L   Total Bilirubin 1.2 0.3 - 1.2 mg/dL   GFR, Estimated >21 >30  mL/min   Anion gap 7 5 - 15     X-ray: CLINICAL DATA:  Fall   EXAM: DG HIP (WITH OR WITHOUT PELVIS) 2-3V LEFT; LEFT FEMUR 2 VIEWS   COMPARISON:  None Available.   FINDINGS: Left hip: There is an acute comminuted left femoral intratrochanteric fracture. There is superolateral displacement of the distal fracture fragment. There is no dislocation. Surgical clips overlie the right pelvis.   Left femur: There are no additional fractures of the left femur. There is no dislocation. Soft tissues are within normal limits.   IMPRESSION: Acute comminuted left femoral intratrochanteric fracture with superolateral displacement of the distal fracture fragment.     Electronically Signed   By: Darliss Cheney M.D.  ROS As per HPI  Blood pressure (!) 159/64, pulse 70, temperature 98.8 F (37.1 C), temperature source Oral, resp. rate 17, height 5\' 9"  (1.753 m), weight 69.7 kg, SpO2 97 %.  Physical Exam: Constitutional: NAD, calm, comfortable Eyes: PERRL, lids and conjunctivae normal ENMT: Mucous membranes are moist. Posterior pharynx clear of any exudate or lesions.Normal dentition.  Neck: normal, supple, no masses, no thyromegaly Respiratory: clear to auscultation bilaterally, no wheezing, no crackles. Normal respiratory effort. No accessory muscle use.  Cardiovascular: Regular rate and rhythm, no murmurs / rubs / gallops. No extremity edema. 2+ pedal pulses. No carotid bruits.  Abdomen: no tenderness, no masses palpated. No hepatosplenomegaly. Bowel sounds positive.  Musculoskeletal: Decreased range of motion on the left side.  Tenderness, no joint swelling or tenderness. LLE contracted and externally rotated Skin: no rashes, lesions, ulcers. No induration Neurologic: Dementia Psychiatric: Demented  Assessment/Plan: Left intertrochanteric hip fracture  Plan: For at least palliative if not functional standpoint fixation of fracture would be indicated unless otherwise prohibitive or  decided differently by family NPO now for hopeful OR time today versus tomorrow I will contact family to review  [name]  04/24/2023, 6:58 AM

## 2023-04-24 NOTE — Discharge Instructions (Signed)

## 2023-04-24 NOTE — Progress Notes (Signed)
[facility] PROGRESS NOTE  VINCIENT [name] (DOB: February 23, 1934) ZOX:[id number] PCP: [name], MD  Brief Narrative: Darren [name] is an 89 y.o. male with a history of CVA, dementia, PD, HTN, HLD who presented to the ED on 04/23/2023 from home after a witnessed fall onto his left side. XR confirmed comminuted left intertrochanteric femur fracture for which he was admitted with plans for operative management 6/3.   Subjective: Pain controlled, no other complaints. Caretaker at bedside.  Objective: BP (!) 159/64 (BP Location: Right Arm)   Pulse 70   Temp 98.8 F (37.1 C) (Oral)   Resp 17   Ht 5\' 9"  (1.753 m)   Wt 69.7 kg   SpO2 97%   BMI 22.69 kg/m   Gen: Elderly, frail in no distress Pulm: Clear, nonlabored  CV: RRR, no MRG, trace pitting edema.  GI: Soft, NT, ND, +BS  Neuro: Alert and disoriented. No new focal deficits. Ext: Warm, dry. LLE shortened and externally rotated.  Skin: No rashes, lesions or ulcers on visualized skin   Assessment & Plan: Left intertrochanter fracture: Comminuted, displaced, due to fall at home without head trauma.  - Keep NPO pending orthopedics plan for operative management. Seen by Dr. Charlann Boxer.  - Continue pain control as ordered - Postoperative PT/OT, weight bearing rec's per ortho.   Parkinson's disease, dementia: May contributed to fall. No reports of syncopal symptoms/signs.  - Continue sinemet, PT.  - Delirium precautions.   Normocytic anemia:  - Monitor perioperatively.   History of CVA:  - VTE ppx per ortho, continue statin, BP control.  HTN:  - Restart home norvasc, holding benazepril to avoid hypotension.  HLD:  - Continue statin after surgery.  GERD:  - PPI    Tyrone Nine, MD [facility] [email or web address] 04/24/2023, 9:21 AM

## 2023-04-25 ENCOUNTER — Encounter (HOSPITAL_COMMUNITY): Payer: Self-pay | Admitting: Orthopedic Surgery

## 2023-04-25 DIAGNOSIS — S7222XA Displaced subtrochanteric fracture of left femur, initial encounter for closed fracture: Secondary | ICD-10-CM | POA: Diagnosis not present

## 2023-04-25 LAB — COMPREHENSIVE METABOLIC PANEL
ALT: 41 U/L (ref 0–44)
AST: 159 U/L — ABNORMAL HIGH (ref 15–41)
Albumin: 3.4 g/dL — ABNORMAL LOW (ref 3.5–5.0)
Alkaline Phosphatase: 113 U/L (ref 38–126)
Anion gap: 7 (ref 5–15)
BUN: 26 mg/dL — ABNORMAL HIGH (ref 8–23)
CO2: 24 mmol/L (ref 22–32)
Calcium: 8.4 mg/dL — ABNORMAL LOW (ref 8.9–10.3)
Chloride: 111 mmol/L (ref 98–111)
Creatinine, Ser: 0.98 mg/dL (ref 0.61–1.24)
GFR, Estimated: >60 mL/min (ref >60)
Glucose, Bld: 120 mg/dL — ABNORMAL HIGH (ref 70–99)
Potassium: 4.1 mmol/L (ref 3.5–5.1)
Sodium: 142 mmol/L (ref 135–145)
Total Bilirubin: 0.5 mg/dL (ref 0.3–1.2)
Total Protein: 6 g/dL — ABNORMAL LOW (ref 6.5–8.1)

## 2023-04-25 LAB — CBC
HCT: 26.2 % — ABNORMAL LOW (ref 39.0–52.0)
Hemoglobin: 8.5 g/dL — ABNORMAL LOW (ref 13.0–17.0)
MCH: 31.1 pg (ref 26.0–34.0)
MCHC: 32.4 g/dL (ref 30.0–36.0)
MCV: 96 fL (ref 80.0–100.0)
Platelets: 185 10*3/uL (ref 150–400)
RBC: 2.73 MIL/uL — ABNORMAL LOW (ref 4.22–5.81)
RDW: 13.5 % (ref 11.5–15.5)
WBC: 7.3 10*3/uL (ref 4.0–10.5)
nRBC: 0 % (ref 0.0–0.2)

## 2023-04-25 NOTE — Plan of Care (Signed)
  Problem: Elimination: Goal: Will not experience complications related to urinary retention Outcome: Progressing   Problem: Safety: Goal: Ability to remain free from injury will improve Outcome: Progressing   Problem: Education: Goal: Knowledge of the prescribed therapeutic regimen will improve Outcome: Progressing   Problem: Pain Management: Goal: Pain level will decrease with appropriate interventions Outcome: Progressing

## 2023-04-25 NOTE — Progress Notes (Signed)
[facility] PROGRESS NOTE  Darren [name] (DOB: [birth year]) ZOX:[id number] PCP: [name], MD  Brief Narrative: Darren [name] is an 89 y.o. male with a history of CVA, dementia, PD, HTN, HLD who presented to the ED on 04/23/2023 from home after a witnessed fall onto his left side. XR confirmed comminuted left intertrochanteric femur fracture for which he was admitted with plans for operative management, undergoing IM nail placement 6/3. Postoperative PT/OT evaluations pending, though anticipate SNF placement will be required.  Subjective: Didn't sleep much last night, delirium. This AM sleeping since caretaker arrived, hasn't gotten breakfast tray yet. Awakens, no pain.  Objective: BP (!) 149/66 (BP Location: Right Arm)   Pulse 91   Temp 97.8 F (36.6 C)   Resp 18   Ht 5\' 9"  (1.753 m)   Wt 69.7 kg   SpO2 94%   BMI 22.69 kg/m   Gen: No distress, elderly male Pulm: Clear, nonlabored  CV: RRR, soft early systolic murmur, no RG, no pitting edema GI: Soft, NT, ND, +BS Neuro: No new focal deficits. Ext: Warm, no deformities. Reacts to touch on both LE's, resists ankle flexion BL LE's.  Skin: Left thigh with surgical dressings c/d/i. No new rashes, lesions or ulcers on visualized skin   Assessment & Plan: Left intertrochanter fracture: Comminuted, displaced, due to fall at home without head trauma. s/p IM nail 6/3 by Dr. Charlann Boxer.  - Continue pain control as ordered - Postoperative PT/OT, weight bearing rec's per ortho.  - ASA 81mg  BID per ortho.  Parkinson's disease, dementia: May contributed to fall. No reports of syncopal symptoms/signs.  - Continue sinemet, PT.  - Delirium precautions.   Normocytic anemia:  - Monitor perioperatively. Recheck pending this AM.  History of CVA:  - VTE ppx w/ASA BID per ortho, continue statin, BP control.  HTN:  - Restart home norvasc, ACEi pending metabolic profile this AM.  HLD:  - Continue statin after surgery.  GERD:  -  PPI   AST >> ALT elevation:  - Recheck   Tyrone Nine, MD [facility] [email or web address] 04/25/2023, 8:56 AM

## 2023-04-25 NOTE — Evaluation (Signed)
Physical Therapy Evaluation Patient Details Name: Darren [name] MRN: [id number] DOB: February 24, 1934 Today's Date: 04/25/2023  History of Present Illness  89 y.o. male  who presented to the ED on 04/23/2023 from home after a witnessed fall onto his left side. XRay confirmed comminuted left intertrochanteric femur fracture for which he was admitted with plans for operative management, undergoing IM nail placement 04/24/23 PMH: CVA, dementia, PD, HTN, HLD  Clinical Impression  Pt admitted with above diagnosis.  PT is grossly supervision at baseline, has 24 hr assist at home; will likely need post acute rehab to incr independence and safety prior to return home   Pt currently with functional limitations due to the deficits listed below (see PT Problem List). Pt will benefit from acute skilled PT to increase their independence and safety with mobility to allow discharge.          Recommendations for follow up therapy are one component of a multi-disciplinary discharge planning process, led by the attending physician.  Recommendations may be updated based on patient status, additional functional criteria and insurance authorization.  Follow Up Recommendations Can patient physically be transported by private vehicle: No     Assistance Recommended at Discharge Frequent or constant Supervision/Assistance  Patient can return home with the following  A lot of help with bathing/dressing/bathroom;A lot of help with walking and/or transfers;Assist for transportation;Help with stairs or ramp for entrance;Assistance with cooking/housework;Direct supervision/assist for financial management;Direct supervision/assist for medications management    Equipment Recommendations None recommended by PT;Other (comment) (defer to SNF)  Recommendations for Other Services       Functional Status Assessment Patient has had a recent decline in their functional status and demonstrates the ability to make significant improvements  in function in a reasonable and predictable amount of time.     Precautions / Restrictions Precautions Precautions: Fall Restrictions Weight Bearing Restrictions: Yes LLE Weight Bearing: Partial weight bearing LLE Partial Weight Bearing Percentage or Pounds: 50%      Mobility  Bed Mobility Overal bed mobility: Needs Assistance Bed Mobility: Supine to Sit     Supine to sit: Min assist, Mod assist, +2 for safety/equipment, +2 for physical assistance     General bed mobility comments: assist to progress LEs off bed and elevate trunk, incr time; pt performs partial roll to L without cues to do so    Transfers Overall transfer level: Needs assistance Equipment used: Rolling walker (2 wheels) Transfers: Sit to/from Stand Sit to Stand: Min assist, +2 physical assistance, From elevated surface, +2 safety/equipment           General transfer comment: multi-modal cues for hand placement, LLE Position. assist to rise and stabilize, initial posterior bias    Ambulation/Gait Ambulation/Gait assistance: Min assist, +2 physical assistance, +2 safety/equipment, Mod assist Gait Distance (Feet): 10 Feet Assistive device: Rolling walker (2 wheels) Gait Pattern/deviations: Step-to pattern, Decreased dorsiflexion - right, Decreased dorsiflexion - left, Decreased step length - left, Decreased step length - right       General Gait Details: cues for sequence, PWB, RW position, anterior wt shift/midline; assist to progress RW and balance throughout distance with level of assist varying from in to mod of 2  Stairs            Wheelchair Mobility    Modified Rankin (Stroke Patients Only)       Balance Overall balance assessment: Needs assistance, History of Falls Sitting-balance support: Feet supported, Single extremity supported Sitting balance-Leahy Scale: Fair  Standing balance support: Reliant on assistive device for balance, During functional activity Standing  balance-Leahy Scale: Poor Standing balance comment: reliant on UEs and external assist                             Pertinent Vitals/Pain Pain Assessment Pain Assessment: Faces Faces Pain Scale: Hurts a little bit Pain Location: L hip Pain Descriptors / Indicators: Grimacing, Guarding Pain Intervention(s): Limited activity within patient's tolerance, Monitored during session, Premedicated before session, Repositioned, Ice applied    Home Living Family/patient expects to be discharged to:: Unsure Living Arrangements: Alone Available Help at Discharge: Personal care attendant;Available 24 hours/day Type of Home: House Home Access: Stairs to enter   [employer] of Steps: 1   Home Layout: One level Home Equipment: Rollator (4 wheels)      Prior Function Prior Level of Function : Needs assist             Mobility Comments: mostly  supervision for gait with rollator per caregiver ADLs Comments: caregiver reports supervision for bathing/dressing     Hand Dominance        Extremity/Trunk Assessment   Upper Extremity Assessment Upper Extremity Assessment: Defer to OT evaluation    Lower Extremity Assessment Lower Extremity Assessment: LLE deficits/detail;RLE deficits/detail;Difficult to assess due to impaired cognition RLE Deficits / Details: grossly 3+ to 4/5 RLE Coordination: decreased gross motor;decreased fine motor LLE Deficits / Details: AAROM grossly WFL, strength testing ltd by pain and cognition LLE Coordination: decreased fine motor;decreased gross motor       Communication   Communication: No difficulties  Cognition Arousal/Alertness: Awake/alert Behavior During Therapy: Flat affect Overall Cognitive Status: History of cognitive impairments - at baseline                                 General Comments: verbalizes minimally, follows one step commands ~ 75% of time with incr time given, decr initiation/slow processing  at times        General Comments      Exercises     Assessment/Plan    PT Assessment Patient needs continued PT services  PT Problem List Decreased strength;Decreased activity tolerance;Decreased balance;Decreased mobility;Decreased knowledge of precautions;Decreased safety awareness;Decreased knowledge of use of DME;Decreased cognition;Pain       PT Treatment Interventions DME instruction;Therapeutic exercise;Gait training;Functional mobility training;Therapeutic activities;Patient/family education    PT Goals (Current goals can be found in the Care Plan section)  Acute Rehab PT Goals Patient Stated Goal: rehab PT Goal Formulation: With patient (and caregiver) Time For Goal Achievement: 05/09/23 Potential to Achieve Goals: Good    Frequency Min 3X/week     Co-evaluation               AM-PAC PT "6 Clicks" Mobility  Outcome Measure Help needed turning from your back to your side while in a flat bed without using bedrails?: A Lot Help needed moving from lying on your back to sitting on the side of a flat bed without using bedrails?: Total Help needed moving to and from a bed to a chair (including a wheelchair)?: Total Help needed standing up from a chair using your arms (e.g., wheelchair or bedside chair)?: A Lot Help needed to walk in hospital room?: Total Help needed climbing 3-5 steps with a railing? : Total 6 Click Score: 8    End of Session Equipment Utilized During  Treatment: Gait belt Activity Tolerance: Patient tolerated treatment well Patient left: in chair;with call bell/phone within reach;with chair alarm set;with nursing/sitter in room Nurse Communication: Mobility status PT Visit Diagnosis: Other abnormalities of gait and mobility (R26.89);Difficulty in walking, not elsewhere classified (R26.2)    Time: 1610-9604 PT Time Calculation (min) (ACUTE ONLY): 20 min   Charges:   PT Evaluation $PT Eval Low Complexity: 1 Low          Edell Mesenbrink,  PT  Acute Rehab Dept [facility]) [phone]  04/25/2023   [facility] 04/25/2023, 3:32 PM

## 2023-04-25 NOTE — TOC Progression Note (Signed)
Transition of Care [facility]) - Progression Note   Patient Details  Name: Darren [name] MRN: [id number] Date of Birth: 03/16/1934  Transition of Care [facility]) CM/SW Contact  Ewing Schlein, LCSW Phone Number: 04/25/2023, 3:38 PM  Clinical Narrative: PT evaluation recommended SNF. CSW spoke with son and he is agreeable to CSW faxing out referral. FL2 done; PASRR received. Initial referral faxed out. TOC awaiting bed offers.   Expected Discharge Plan: Skilled Nursing Facility Barriers to Discharge: Continued Medical Work up  Expected Discharge Plan and Services In-house Referral: Clinical Social Work Post Acute Care Choice: Skilled Nursing Facility Living arrangements for the past 2 months: Independent Living Facility  Social Determinants of Health (SDOH) Interventions SDOH Screenings   Food Insecurity: Patient Unable To Answer (04/23/2023)  Housing: Patient Unable To Answer (04/05/2023)  Transportation Needs: No Transportation Needs (04/05/2023)  Depression (PHQ2-9): Low Risk  (04/06/2023)  Financial Resource Strain: Low Risk  (04/05/2023)  Physical Activity: Unknown (04/05/2023)  Social Connections: Moderately Isolated (04/05/2023)  Stress: Stress Concern Present (04/05/2023)  Tobacco Use: Medium Risk (04/25/2023)   Readmission Risk Interventions     No data to display

## 2023-04-25 NOTE — Progress Notes (Signed)
   Subjective: 1 Day Post-Op Procedure(s) (LRB): INTRAMEDULLARY (IM) NAIL INTERTROCHANTERIC (Left)  Patient seen in rounds for Dr. Charlann Boxer.  Patient asleep on exam this morning. He has known advanced dementia. Caretaker at the bedside who I talked with. She is his nighttime caretaker at home, and she reports he has a daytime caretaker as well.   We will start therapy today.   Objective: Vital signs in last 24 hours: Temp:  [97.5 F (36.4 C)-98.7 F (37.1 C)] 98.2 F (36.8 C) (06/04 1318) Pulse Rate:  [65-96] 74 (06/04 1318) Resp:  [10-19] 16 (06/04 1318) BP: (96-165)/(48-88) 150/68 (06/04 1318) SpO2:  [88 %-100 %] 95 % (06/04 1318)  Intake/Output from previous day:  Intake/Output Summary (Last 24 hours) at 04/25/2023 1548 Last data filed at 04/25/2023 1125 Gross per 24 hour  Intake 3218.22 ml  Output 800 ml  Net 2418.22 ml     Intake/Output this shift: Total I/O In: 741.1 [P.O.:120; I.V.:521.1; IV Piggyback:100] Out: 400 [Urine:400]  Labs: Recent Labs    04/23/23 2035 04/24/23 0401 04/25/23 0845  HGB 10.7* 9.5* 8.5*   Recent Labs    04/24/23 0401 04/25/23 0845  WBC 6.3 7.3  RBC 3.03* 2.73*  HCT 29.3* 26.2*  PLT 178 185   Recent Labs    04/24/23 0401 04/25/23 0845  NA 141 142  K 3.9 4.1  CL 111 111  CO2 23 24  BUN 29* 26*  CREATININE 1.10 0.98  GLUCOSE 143* 120*  CALCIUM 8.4* 8.4*   No results for input(s): "LABPT", "INR" in the last 72 hours.  Exam: General - Patient is Alert Extremity - Neurologically intact Intact pulses distally Dorsiflexion/Plantar flexion intact Dressing - dressing C/D/I Motor Function - intact, moving foot and toes well on exam.   Past Medical History:  Diagnosis Date   Asthma    Cataract    Dementia (HCC)    Parkinson's disease    Stroke (HCC)     Assessment/Plan: 1 Day Post-Op Procedure(s) (LRB): INTRAMEDULLARY (IM) NAIL INTERTROCHANTERIC (Left) Principal Problem:   Closed left subtrochanteric femur fracture,  initial encounter (HCC) Active Problems:   Parkinson disease   Hyperlipidemia   History of CVA (cerebrovascular accident)   Gastroesophageal reflux disease without esophagitis   Essential hypertension   Fall at home, initial encounter  Estimated body mass index is 22.69 kg/m as calculated from the following:   Height as of this encounter: 5\' 9"  (1.753 m).   Weight as of this encounter: 69.7 kg. Advance diet Up with therapy  DVT Prophylaxis - Aspirin 81 mg chewable x4 weeks PWB LLE 50%  Sounds like plan is for SNF Will print rx for him Up with PT to practice PWB as best he can given underlying dementia  Dennie Bible, PA-C Orthopedic Surgery [phone] 04/25/2023, 3:48 PM

## 2023-04-25 NOTE — [place] FL2 (Signed)
[place] MEDICAID FL2 LEVEL OF CARE FORM     IDENTIFICATION  Patient Name: Darren [name] Birthdate: 1934-04-03 Sex: male Admission Date (Current Location): 04/23/2023  [facility] and [place] Number:  Producer, television/film/video and Address:  [facility],  [address] [place], [place] [address]      Provider Number: [id number]  Attending Physician Name and Address:  Tyrone Nine, MD  Relative Name and Phone Number:  Lamon Haga (son) Ph: [phone]    Current Level of Care: Hospital Recommended Level of Care: Skilled Nursing Facility Prior Approval Number:    Date Approved/Denied:   PASRR Number: [id number]  Discharge Plan: SNF    Current Diagnoses: Patient Active Problem List   Diagnosis Date Noted   Closed left subtrochanteric femur fracture, initial encounter (HCC) 04/23/2023   Fall at home, initial encounter 04/23/2023   Essential hypertension 07/06/2021   Parkinson disease 11/27/2020   Hyperlipidemia 11/27/2020   History of CVA (cerebrovascular accident) 11/27/2020   Gastroesophageal reflux disease without esophagitis 11/27/2020    Orientation RESPIRATION BLADDER Height & Weight     Self  Normal Continent Weight: 153 lb 10.6 oz (69.7 kg) Height:  5\' 9"  (175.3 cm)  BEHAVIORAL SYMPTOMS/MOOD NEUROLOGICAL BOWEL NUTRITION STATUS      Continent Diet (Regular diet)  AMBULATORY STATUS COMMUNICATION OF NEEDS Skin   Extensive Assist Verbally Surgical wounds, Other (Comment) (Ecchymosis: bilateral arms, legs; Scratches: bilateral arms, hands, legs)                       Personal Care Assistance Level of Assistance  Bathing, Feeding, Dressing Bathing Assistance: Limited assistance Feeding assistance: Independent Dressing Assistance: Limited assistance     Functional Limitations Info  Sight, Hearing, Speech Sight Info: Adequate Hearing Info: Adequate Speech Info: Adequate    SPECIAL CARE FACTORS FREQUENCY  PT (By licensed PT), OT (By licensed OT)      PT Frequency: 5x's/week OT Frequency: 5x's/week            Contractures Contractures Info: Not present    Additional Factors Info  Code Status, Allergies, Psychotropic Code Status Info: Full Allergies Info: NKA Psychotropic Info: Seroquel         Current Medications (04/25/2023):  This is the current hospital active medication list Current Facility-Administered Medications  Medication Dose Route Frequency Provider Last Rate Last Admin   0.9 %  sodium chloride infusion   Intravenous Continuous Cassandria Anger, PA-C   Stopped at 04/25/23 1142   acetaminophen (TYLENOL) tablet 325-650 mg  325-650 mg Oral Q6H PRN Cassandria Anger, PA-C       amLODipine (NORVASC) tablet 10 mg  10 mg Oral Daily Cassandria Anger, PA-C   10 mg at 04/25/23 1142   aspirin chewable tablet 81 mg  81 mg Oral BID Cassandria Anger, PA-C   81 mg at 04/25/23 1142   bisacodyl (DULCOLAX) suppository 10 mg  10 mg Rectal Daily PRN Cassandria Anger, PA-C       carbidopa-levodopa (SINEMET CR) 50-200 MG per tablet controlled release 1 tablet  1 tablet Oral QHS Cassandria Anger, PA-C   1 tablet at 04/24/23 2259   carbidopa-levodopa (SINEMET IR) 25-100 MG per tablet immediate release 1 tablet  1 tablet Oral Q1200 Cassandria Anger, PA-C   1 tablet at 04/25/23 1141   carbidopa-levodopa (SINEMET IR) 25-100 MG per tablet immediate release 2 tablet  2 tablet Oral q AM Cassandria Anger, PA-C  2 tablet at 04/24/23 1003   dextrose 5 % in lactated ringers infusion   Intravenous Continuous Rometta Emery, MD   Stopped at 04/24/23 1303   diphenhydrAMINE (BENADRYL) 12.5 MG/5ML elixir 12.5-25 mg  12.5-25 mg Oral Q4H PRN Cassandria Anger, PA-C       docusate sodium (COLACE) capsule 100 mg  100 mg Oral BID Cassandria Anger, PA-C   100 mg at 04/25/23 1141   menthol-cetylpyridinium (CEPACOL) lozenge 3 mg  1 lozenge Oral PRN Cassandria Anger, PA-C       Or   phenol (CHLORASEPTIC) mouth spray 1 spray  1 spray Mouth/Throat PRN  Cassandria Anger, PA-C       methocarbamol (ROBAXIN) tablet 500 mg  500 mg Oral Q6H PRN Cassandria Anger, PA-C       Or   methocarbamol (ROBAXIN) 500 mg in dextrose 5 % 50 mL IVPB  500 mg Intravenous Q6H PRN Cassandria Anger, PA-C       metoCLOPramide (REGLAN) tablet 5 mg  5 mg Oral Q8H PRN Cassandria Anger, PA-C       Or   metoCLOPramide (REGLAN) injection 5 mg  5 mg Intravenous Q8H PRN Cassandria Anger, PA-C       morphine (PF) 2 MG/ML injection 0.5-1 mg  0.5-1 mg Intravenous Q2H PRN Cassandria Anger, PA-C       ondansetron [facility]) tablet 4 mg  4 mg Oral Q6H PRN Cassandria Anger, PA-C       Or   ondansetron [facility]) injection 4 mg  4 mg Intravenous Q6H PRN Cassandria Anger, PA-C       Oral care mouth rinse  15 mL Mouth Rinse PRN Cassandria Anger, PA-C       pantoprazole (PROTONIX) EC tablet 40 mg  40 mg Oral Daily Cassandria Anger, PA-C   40 mg at 04/25/23 1142   polyethylene glycol (MIRALAX / GLYCOLAX) packet 17 g  17 g Oral BID Cassandria Anger, PA-C   17 g at 04/25/23 1141   QUEtiapine (SEROQUEL) tablet 25 mg  25 mg Oral QHS Cassandria Anger, PA-C   25 mg at 04/24/23 2258     Discharge Medications: Please see discharge summary for a list of discharge medications.  Relevant Imaging Results:  Relevant Lab Results:   Additional Information SSN: [id number]  Ewing Schlein, LCSW

## 2023-04-26 DIAGNOSIS — Z8673 Personal history of transient ischemic attack (TIA), and cerebral infarction without residual deficits: Secondary | ICD-10-CM

## 2023-04-26 DIAGNOSIS — I10 Essential (primary) hypertension: Secondary | ICD-10-CM

## 2023-04-26 DIAGNOSIS — W19XXXA Unspecified fall, initial encounter: Secondary | ICD-10-CM

## 2023-04-26 DIAGNOSIS — S7292XA Unspecified fracture of left femur, initial encounter for closed fracture: Secondary | ICD-10-CM | POA: Diagnosis not present

## 2023-04-26 DIAGNOSIS — S7222XA Displaced subtrochanteric fracture of left femur, initial encounter for closed fracture: Secondary | ICD-10-CM | POA: Diagnosis not present

## 2023-04-26 DIAGNOSIS — Y92009 Unspecified place in unspecified non-institutional (private) residence as the place of occurrence of the external cause: Secondary | ICD-10-CM

## 2023-04-26 DIAGNOSIS — E7800 Pure hypercholesterolemia, unspecified: Secondary | ICD-10-CM

## 2023-04-26 LAB — COMPREHENSIVE METABOLIC PANEL
ALT: 43 U/L (ref 0–44)
AST: 84 U/L — ABNORMAL HIGH (ref 15–41)
Albumin: 3.4 g/dL — ABNORMAL LOW (ref 3.5–5.0)
Alkaline Phosphatase: 102 U/L (ref 38–126)
Anion gap: 8 (ref 5–15)
BUN: 27 mg/dL — ABNORMAL HIGH (ref 8–23)
CO2: 25 mmol/L (ref 22–32)
Calcium: 8.7 mg/dL — ABNORMAL LOW (ref 8.9–10.3)
Chloride: 110 mmol/L (ref 98–111)
Creatinine, Ser: 1 mg/dL (ref 0.61–1.24)
GFR, Estimated: >60 mL/min (ref >60)
Glucose, Bld: 126 mg/dL — ABNORMAL HIGH (ref 70–99)
Potassium: 4.1 mmol/L (ref 3.5–5.1)
Sodium: 143 mmol/L (ref 135–145)
Total Bilirubin: 0.8 mg/dL (ref 0.3–1.2)
Total Protein: 6.4 g/dL — ABNORMAL LOW (ref 6.5–8.1)

## 2023-04-26 LAB — CBC
HCT: 27.9 % — ABNORMAL LOW (ref 39.0–52.0)
Hemoglobin: 9.2 g/dL — ABNORMAL LOW (ref 13.0–17.0)
MCH: 31.6 pg (ref 26.0–34.0)
MCHC: 33 g/dL (ref 30.0–36.0)
MCV: 95.9 fL (ref 80.0–100.0)
Platelets: 191 10*3/uL (ref 150–400)
RBC: 2.91 MIL/uL — ABNORMAL LOW (ref 4.22–5.81)
RDW: 13.3 % (ref 11.5–15.5)
WBC: 8.7 10*3/uL (ref 4.0–10.5)
nRBC: 0 % (ref 0.0–0.2)

## 2023-04-26 MED ORDER — ASPIRIN 81 MG PO CHEW: 81.0000 mg | CHEWABLE_TABLET | Freq: Two times a day (BID) | ORAL | 0 refills | Status: AC

## 2023-04-26 NOTE — Progress Notes (Addendum)
[facility]                                                                              Darren [name], is a 89 y.o. male, DOB - 1934-08-11, WUJ:[id number] Admit date - 04/23/2023    Outpatient Primary MD for the patient is Durene Cal, Aldine Contes, MD  LOS - 3  days  Chief Complaint  Patient presents with   Fall       Brief summary   Darren [name] is an 89 y.o. male with a history of CVA, dementia, PD, HTN, HLD who presented to the ED on 04/23/2023 from home after a witnessed fall onto his left side. XR confirmed comminuted left intertrochanteric femur fracture.  Orthopedics was consulted, underwent closed reduction and IM nail placement on 6/3.   Awaiting SNF  Assessment & Plan    Principal Problem: Mechanical fall with closed left subtrochanteric femur fracture(HCC) -Orthopedics consulted, underwent closed reduction and IM nail placement on 6/3, postop day #2 -Orthopedics following, seen by PT recommended SNF -Patient's family requesting SLP evaluation today -Per orthopedics, aspirin 81 mg daily for 4 weeks, PWB LLE 50%  Active Problems:   Parkinson disease with advanced dementia, aspiration -Has chronic aspiration, per family worse today, SLP evaluation today -Continue Sinemet, PT     Hyperlipidemia -Continue statin     History of CVA (cerebrovascular accident) -Continue statin, aspirin, BP control     Gastroesophageal reflux disease without esophagitis -Continue PPI     Essential hypertension -Norvasc resumed     Fall at home, initial encounter PT recommended SNF.  Estimated body mass index is 22.69 kg/m as calculated from the following:   Height as of this encounter: 5\' 9"  (1.753 m).   Weight as of this encounter: 69.7 kg.  Code Status: DNR DVT Prophylaxis:  SCDs Start: 04/24/23 1945 Place TED hose Start: 04/24/23 1945 SCDs Start: 04/23/23 2309   Level of Care: Level of care: Med-Surg Family Communication: Updated patient's daughter at  the bedside.  Also  caregiver at the bedside Disposition Plan:      Remains inpatient appropriate: Pending SNF once SLP is completed   Procedures:  Closed reduction, intramedullary nailing of left intertrochanteric femur fracture   Consultants:   Orthopedics  Antimicrobials:   Anti-infectives (From admission, onward)    Start     Dose/Rate Route Frequency Ordered Stop   04/25/23 0600  ceFAZolin (ANCEF) IVPB 2g/100 mL premix  Status:  Discontinued        2 g 200 mL/hr over 30 Minutes Intravenous On call to O.R. 04/24/23 1316 04/24/23 1319   04/25/23 0000  ceFAZolin (ANCEF) IVPB 2g/100 mL premix        2 g 200 mL/hr over 30 Minutes Intravenous Every 6 hours 04/24/23 1944 04/25/23 1143   04/24/23 1700  ceFAZolin (ANCEF) IVPB 2g/100 mL premix        2 g 200 mL/hr over 30 Minutes Intravenous On call to O.R. 04/24/23 1319 04/24/23 1731          Medications  amLODipine  10 mg Oral Daily   aspirin  81 mg Oral BID  carbidopa-levodopa  1 tablet Oral QHS   carbidopa-levodopa  1 tablet Oral Q1200   carbidopa-levodopa  2 tablet Oral q AM   docusate sodium  100 mg Oral BID   pantoprazole  40 mg Oral Daily   polyethylene glycol  17 g Oral BID   QUEtiapine  25 mg Oral QHS      Subjective:   Darren [name] was seen and examined today.  Alert and awake, mental status close to baseline per daughter at the bedside, has advanced dementia.  No acute issues overnight.  Pain controlled.    Objective:   Vitals:   04/25/23 1123 04/25/23 1318 04/25/23 1755 04/26/23 0205  BP: (!) 149/72 (!) 150/68 (!) 162/64 (!) 130/108  Pulse: 91 74 92 96  Resp: 16 16 16 16   Temp: 98.7 F (37.1 C) 98.2 F (36.8 C) 98.8 F (37.1 C) 98.6 F (37 C)  TempSrc: Oral Oral Oral Oral  SpO2: 98% 95% 97% 96%  Weight:      Height:        Intake/Output Summary (Last 24 hours) at 04/26/2023 1258 Last data filed at 04/26/2023 1215 Gross per 24 hour  Intake 545.35 ml  Output --  Net 545.35 ml     Wt  Readings from Last 3 Encounters:  04/23/23 69.7 kg  04/06/23 69.8 kg  03/30/23 72.8 kg     Exam General: Alert and awake, oriented to self, NAD, daughter at the bedside Cardiovascular: S1 S2 auscultated,  RRR Respiratory: Diminished breath sounds at the bases Gastrointestinal: Soft, nontender, nondistended, + bowel sounds Ext: no pedal edema bilaterally Neuro: no new FND's Psych: dementia      Data Reviewed:  I have personally reviewed following labs    CBC Lab Results  Component Value Date   WBC 8.7 04/26/2023   RBC 2.91 (L) 04/26/2023   HGB 9.2 (L) 04/26/2023   HCT 27.9 (L) 04/26/2023   MCV 95.9 04/26/2023   MCH 31.6 04/26/2023   PLT 191 04/26/2023   MCHC 33.0 04/26/2023   RDW 13.3 04/26/2023   LYMPHSABS 0.4 (L) 03/30/2023   MONOABS 0.6 03/30/2023   EOSABS 0.1 03/30/2023   BASOSABS 0.0 03/30/2023     Last metabolic panel Lab Results  Component Value Date   NA 143 04/26/2023   K 4.1 04/26/2023   CL 110 04/26/2023   CO2 25 04/26/2023   BUN 27 (H) 04/26/2023   CREATININE 1.00 04/26/2023   GLUCOSE 126 (H) 04/26/2023   GFRNONAA >60 04/26/2023   CALCIUM 8.7 (L) 04/26/2023   PROT 6.4 (L) 04/26/2023   ALBUMIN 3.4 (L) 04/26/2023   BILITOT 0.8 04/26/2023   ALKPHOS 102 04/26/2023   AST 84 (H) 04/26/2023   ALT 43 04/26/2023   ANIONGAP 8 04/26/2023    CBG (last 3)  No results for input(s): "GLUCAP" in the last 72 hours.    Coagulation Profile: No results for input(s): "INR", "PROTIME" in the last 168 hours.   Radiology Studies: I have personally reviewed the imaging studies  DG HIP UNILAT WITH PELVIS 2-3 VIEWS LEFT  Result Date: 04/24/2023 CLINICAL DATA:  Elective surgery. EXAM: DG HIP (WITH OR WITHOUT PELVIS) 2-3V LEFT COMPARISON:  Preoperative radiograph FINDINGS: Seven fluoroscopic spot views of the left hip and pelvis obtained in the operating room. Intramedullary nail with trans trochanteric and distal locking screw fixation traverse intertrochanteric  femur fracture. Fluoroscopy time 33 seconds. Dose 4.84 mGy. IMPRESSION: Fluoroscopic spot views for intertrochanteric femur fracture ORIF. Electronically Signed   By:  Narda Rutherford M.D.   On: 04/24/2023 18:21   DG C-Arm 1-60 Min-No Report  Result Date: 04/24/2023 Fluoroscopy was utilized by the requesting physician.  No radiographic interpretation.       Thad Ranger M.D. [facility] 04/26/2023, 12:58 PM  Available via Epic secure chat 7am-7pm After 7 pm, please refer to night coverage provider listed on amion.

## 2023-04-26 NOTE — Evaluation (Signed)
Occupational Therapy Evaluation Patient Details Name: Darren [name] MRN: [id number] DOB: 1933-12-22 Today's Date: 04/26/2023   History of Present Illness 89 y.o. male  who presented to the ED on 04/23/2023 from home after a witnessed fall onto his left side. x-ray confirmed comminuted left intertrochanteric femur fracture for which he was admitted with plans for operative management; s/p IM nail 04/24/23 PMH: CVA, dementia, Parkinson's disease, HTN, HLD   Clinical Impression    The pt is currently presenting below his baseline level of functioning for self-care management. During the session today, he was noted to be with lethargy and intermittent confusion. Per his caregiver, he becomes more lethargic and confused in the late afternoon/evening. He currently requires increased assist for tasks, such as dressing, bed mobility, and toileting. He will benefit from further OT services to facilitate progressive ADL performance and decrease the risk for restricted participation in meaningful activities.      Recommendations for follow up therapy are one component of a multi-disciplinary discharge planning process, led by the attending physician.  Recommendations may be updated based on patient status, additional functional criteria and insurance authorization.   Assistance Recommended at Discharge Frequent or constant Supervision/Assistance  Patient can return home with the following Assist for transportation;Assistance with cooking/housework;Direct supervision/assist for medications management;A lot of help with bathing/dressing/bathroom    Functional Status Assessment  Patient has had a recent decline in their functional status and demonstrates the ability to make significant improvements in function in a reasonable and predictable amount of time.  Equipment Recommendations  Other (comment) (to be determined pending progress at next setting)    Recommendations for Other Services       Precautions  / Restrictions Precautions Precautions: Fall Restrictions Weight Bearing Restrictions: Yes LLE Weight Bearing: Partial weight bearing LLE Partial Weight Bearing Percentage or Pounds: up to 50% weightbearing      Mobility Bed Mobility Overal bed mobility: Needs Assistance Bed Mobility: Rolling Rolling: Max assist                          ADL either performed or assessed with clinical judgement   ADL Overall ADL's : Needs assistance/impaired Eating/Feeding: Minimal assistance;Bed level Eating/Feeding Details (indicate cue type and reason): based on clinical judgement Grooming: Minimal assistance;Bed level Grooming Details (indicate cue type and reason): based on clinical judgement         Upper Body Dressing : Minimal assistance Upper Body Dressing Details (indicate cue type and reason): simulated at bed level Lower Body Dressing: Maximal assistance;Bed level                                 Pertinent Vitals/Pain Pain Assessment Pain Assessment: Faces Pain Score: 4  Pain Location: L hip Pain Descriptors / Indicators: Grimacing, Guarding Pain Intervention(s): Limited activity within patient's tolerance, Monitored during session, Other (comment) (Pt's caregiver stated he was given pain medication prior to session)     Hand Dominance Right   Extremity/Trunk Assessment Upper Extremity Assessment Upper Extremity Assessment: Difficult to assess due to impaired cognition and pt lethargy; he appeared to have functional BUE AROM and functional grip strength bilaterally)   Lower Extremity Assessment Lower Extremity Assessment: Difficult to assess due to impaired cognition and pt lethargy)       Communication     Cognition Arousal/Alertness: Lethargic Behavior During Therapy: Flat affect Overall Cognitive Status: History of cognitive impairments -  at baseline          General Comments: Pt becomes more lethargic in the evenings, per his caregiver.  He was oriented to person, disoriented to place, disoriented to time, and partially oriented to situation. He required occasional repetition of prompts for command follow     General Comments               Home Living   Living Arrangements: Spouse/significant other Available Help at Discharge: Personal care attendant;Available 24 hours/day Type of Home: Independent living facility       Home Layout: One level     Bathroom Shower/Tub: Runner, broadcasting/film/video: Rollator (4 wheels);Shower seat;BSC/3in1          Prior Functioning/Environment Prior Level of Function : Needs assist             Mobility Comments: mostly  supervision for ambulation with rollator, per caregiver ADLs Comments: He performed self-feeding without assist, he required SBA for bathing/showers, and he required assist for upper and lower body dressing; he mostly managed toileting without assistance. His caregivers performed cooking and cleaning. He has 24/7 in home caregivers.        OT Problem List: Decreased strength;Decreased range of motion;Impaired balance (sitting and/or standing);Decreased cognition;Decreased safety awareness;Decreased knowledge of use of DME or AE;Decreased knowledge of precautions;Pain      OT Treatment/Interventions: Self-care/ADL training;Therapeutic exercise;Energy conservation;DME and/or AE instruction;Therapeutic activities;Cognitive remediation/compensation;Patient/family education;Balance training    OT Goals(Current goals can be found in the care plan section) Acute Rehab OT Goals OT Goal Formulation: With patient Time For Goal Achievement: 05/10/23 Potential to Achieve Goals: Good ADL Goals Pt Will Perform Grooming: with set-up;sitting Pt Will Perform Upper Body Dressing: with set-up;sitting Pt Will Perform Lower Body Dressing: with min guard assist;sit to/from stand Pt Will Transfer to Toilet: with min guard assist;ambulating Pt Will Perform  Toileting - Clothing Manipulation and hygiene: with min guard assist;sit to/from stand  OT Frequency: Min 1X/week       AM-PAC OT "6 Clicks" Daily Activity     Outcome Measure Help from another person eating meals?: A Little Help from another person taking care of personal grooming?: A Little Help from another person toileting, which includes using toliet, bedpan, or urinal?: A Lot Help from another person bathing (including washing, rinsing, drying)?: A Lot Help from another person to put on and taking off regular upper body clothing?: A Little Help from another person to put on and taking off regular lower body clothing?: A Lot 6 Click Score: 15   End of Session Equipment Utilized During Treatment: Other (comment) (none) Nurse Communication: Mobility status  Activity Tolerance: Other (comment) (limited by lethargy and occasional confusion) Patient left: in bed;with call bell/phone within reach;with bed alarm set;with family/visitor present  OT Visit Diagnosis: Unsteadiness on feet (R26.81);Muscle weakness (generalized) (M62.81)                Time: 1610-9604 OT Time Calculation (min): 21 min Charges:  OT General Charges $OT Visit: 1 Visit OT Evaluation $OT Eval Moderate Complexity: 1 Mod    Russ Looper L Soniyah Mcglory, OTR/L 04/26/2023, 4:41 PM

## 2023-04-26 NOTE — Evaluation (Addendum)
Clinical/Bedside Swallow Evaluation Patient Details  Name: Darren [name] MRN: [id number] Date of Birth: Oct 28, 1934  Today's Date: 04/26/2023 Time: SLP Start Time (ACUTE ONLY): 1325 SLP Stop Time (ACUTE ONLY): 1408 SLP Time Calculation (min) (ACUTE ONLY): 43 min  Past Medical History:  Past Medical History:  Diagnosis Date   Asthma    Cataract    Dementia (HCC)    Parkinson's disease    Stroke [facility])    Past Surgical History:  Past Surgical History:  Procedure Laterality Date   APPENDECTOMY     cataract surgery- both eyes     CHOLECYSTECTOMY     INTRAMEDULLARY (IM) NAIL INTERTROCHANTERIC Left 04/24/2023   Procedure: INTRAMEDULLARY (IM) NAIL INTERTROCHANTERIC;  Surgeon: Durene Romans, MD;  Location: [facility];  Service: Orthopedics;  Laterality: Left;  Biomet   HPI:  89 yo male adm to [facility] after fall with subsequent hip fx s/lp surgical repair.  Pt with PMH + for Parkinson's disease, dementia, recent fall, dysphagia requring heimlich manuever d/t choking on solid foods in dining hall per son discussion with this SLP.  Caregiver,Darren [name], present and reports pt has been coughing with intake since she has worked with him over the last five months.  Fortunately pt has maintained his weight and has not had pneumonias.  Swallow eval ordered per family request.    Assessment / Plan / Recommendation  Clinical Impression  Patient has acute on chronic dysphagia resulting in nonproductive cough across all po trials.  He was rather sleepy during session, and benefited from consistent cues to stay alert for intake.  Caregiver,Darren [name], reports pt has been coughing with po intake chronically - denies it is worse now than at baseline.    He did not tolerate slightly thicker liquid better than thin water.  Impaired mastication ability resulted in oral holding/retention - which was cleared with slighly thicker consistency.  SLP called son, Darren [name], and spoke to him regarding pt's h/o dysphagia, increased  ramifications from current impaired mobility due to fracture.  Darren [name] verbalized that his mother states that "eating is one of Randy's things he has left" that give him QOL and he desires to continue soft/thin diet regardless of dysphagia and aspiration.  Darren [name] reports pt does not want thicker liquids *which SLP would not advise* nor pudding consistency foods.  At this time, pt is likely having aspiration and will aspirate regardless of diet.     Darren [name] verbalized understanding of increased pulmonary ramification risk including pneumonias, airway blockage, malnutrition and dehydration.  Darren [name] states pt is a DNR - requested picture copy of DNR paperwork which will be sent to pt's caregiver per POA.   SLP inquired re: palliative consult to help establish goals and help manage chronic medical issues - son agreeable to receive call to make appointment as he desires his mother involved.    Careful hand feeding may be optimal care plan for this pt with dementia, Parkinson's and progressive dysphagia.   SLP will follow up for exercises to strengthen swallow musculature and airway clearance to mitigate aspiration.  Pt's son agreeable to plan. SLP Visit Diagnosis: Dysphagia, pharyngoesophageal phase (R13.14);Dysphagia, oropharyngeal phase (R13.12)    Aspiration Risk  Moderate aspiration risk;Severe aspiration risk    Diet Recommendation Dysphagia 3 (Mech soft);Thin liquid (with accepted aspiration risk)   Liquid Administration via: Straw Medication Administration: Crushed with puree Supervision: Full supervision/cueing for compensatory strategies Compensations: Slow rate;Small sips/bites Postural Changes: Seated upright at 90 degrees;Remain upright for at least 30 minutes after po intake  Other  Recommendations Oral Care Recommendations: Oral care QID    Recommendations for follow up therapy are one component of a multi-disciplinary discharge planning process, led by the attending physician.   Recommendations may be updated based on patient status, additional functional criteria and insurance authorization.  Follow up Recommendations   Follow up SLP at next venue of care if aligns with GOC N/a    Assistance Recommended at Discharge  N/a  Functional Status Assessment Patient has had a recent decline in their functional status and/or demonstrates limited ability to make significant improvements in function in a reasonable and predictable amount of time  Frequency and Duration min 1 x/week  1 week       Prognosis Prognosis for improved oropharyngeal function: Fair Barriers to Reach Goals: Cognitive deficits;Time post onset;Severity of deficits      Swallow Study   General Date of Onset: 04/26/23 HPI: 89 yo male adm to [facility] after fall with subsequent hip fx s/lp surgical repair.  Pt with PMH + for Parkinson's disease, dementia, recent fall, dysphagia requring heimlich manuever d/t choking on solid foods in dining hall per son discussion with this SLP.  Caregiver,Darren [name], present and reports pt has been coughing with intake since she has worked with him over the last five months.  Fortunately pt has maintained his weight and has not had pneumonias.  Swallow eval ordered per family request. Type of Study: Bedside Swallow Evaluation Previous Swallow Assessment: prior OP MBS showed decreased strength with retention, impaired UES opening and silent aspiration - rec soft/thin with precautions, multiple swallows, hard swallow, follow solds *after several dry swallows* with liquids - retention much worse with solids than liquids per report Diet Prior to this Study: Dysphagia 3 (mechanical soft);Thin liquids (Level 0) Temperature Spikes Noted: No Respiratory Status: Room air Behavior/Cognition: Lethargic/Drowsy;Other (Comment);Doesn't follow directions (inconsistently follows directinos) Oral Cavity Assessment: Within Functional Limits Oral Care Completed by SLP: No Oral Cavity - Dentition:  Adequate natural dentition Self-Feeding Abilities: Total assist Patient Positioning: Upright in bed Baseline Vocal Quality: Low vocal intensity Volitional Cough: Weak Volitional Swallow: Unable to elicit    Oral/Motor/Sensory Function Overall Oral Motor/Sensory Function: Generalized oral weakness (? slight lingual tremor)   Ice Chips Ice chips: Not tested   Thin Liquid Thin Liquid: Impaired Presentation: Straw Oral Phase Impairments: Reduced labial seal Pharyngeal  Phase Impairments: Multiple swallows;Cough - Delayed;Wet Vocal Quality    Nectar Thick Nectar Thick Liquid: Impaired Presentation: Straw Pharyngeal Phase Impairments: Multiple swallows;Cough - Delayed   Honey Thick Honey Thick Liquid: Not tested   Puree Puree: Impaired Presentation: Spoon Pharyngeal Phase Impairments: Multiple swallows;Cough - Delayed   Solid     Solid: Impaired Oral Phase Impairments: Reduced lingual movement/coordination Oral Phase Functional Implications: Oral residue;Other (comment);Impaired mastication Pharyngeal Phase Impairments: Cough - Delayed      Darren [name] 04/26/2023,2:49 PM  Rolena Infante, MS [facility] SLP Acute Rehab Services Office [phone]

## 2023-04-26 NOTE — TOC Progression Note (Signed)
Transition of Care [facility]) - Progression Note   Patient Details  Name: Darren [name] MRN: [id number] Date of Birth: 17-Aug-1934  [facility]) CM/SW Contact  Ewing Schlein, LCSW Phone Number: 04/26/2023, 2:24 PM  Clinical Narrative: Patient is not yet medically ready to discharge today due to swallow evaluation. CSW confirmed bed will be available tomorrow with [facility] admissions. CSW updated son.  Expected Discharge Plan: Skilled Nursing Facility Barriers to Discharge: Continued Medical Work up  Expected Discharge Plan and Services In-house Referral: Clinical Social Work Post Acute Care Choice: Skilled Nursing Facility Living arrangements for the past 2 months: Independent Living Facility  Social Determinants of Health (SDOH) Interventions SDOH Screenings   Food Insecurity: Patient Unable To Answer (04/23/2023)  Housing: Patient Unable To Answer (04/05/2023)  Transportation Needs: No Transportation Needs (04/05/2023)  Depression (PHQ2-9): Low Risk  (04/06/2023)  Financial Resource Strain: Low Risk  (04/05/2023)  Physical Activity: Unknown (04/05/2023)  Social Connections: Moderately Isolated (04/05/2023)  Stress: Stress Concern Present (04/05/2023)  Tobacco Use: Medium Risk (04/25/2023)   Readmission Risk Interventions     No data to display

## 2023-04-26 NOTE — Plan of Care (Signed)
  Problem: Coping: Goal: Level of anxiety will decrease Outcome: Progressing   Problem: Pain Managment: Goal: General experience of comfort will improve Outcome: Progressing   Problem: Safety: Goal: Ability to remain free from injury will improve Outcome: Progressing   

## 2023-04-27 DIAGNOSIS — S7222XA Displaced subtrochanteric fracture of left femur, initial encounter for closed fracture: Secondary | ICD-10-CM | POA: Diagnosis not present

## 2023-04-27 DIAGNOSIS — I10 Essential (primary) hypertension: Secondary | ICD-10-CM | POA: Diagnosis not present

## 2023-04-27 DIAGNOSIS — K219 Gastro-esophageal reflux disease without esophagitis: Secondary | ICD-10-CM

## 2023-04-27 DIAGNOSIS — S7292XA Unspecified fracture of left femur, initial encounter for closed fracture: Secondary | ICD-10-CM | POA: Diagnosis not present

## 2023-04-27 DIAGNOSIS — W19XXXA Unspecified fall, initial encounter: Secondary | ICD-10-CM | POA: Diagnosis not present

## 2023-04-27 MED ORDER — DOCUSATE SODIUM 100 MG PO CAPS: 100.0000 mg | ORAL_CAPSULE | Freq: Two times a day (BID) | ORAL | 0 refills | Status: DC

## 2023-04-27 MED ORDER — CARBIDOPA-LEVODOPA 25-250 MG PO TABS
1.0000 | ORAL_TABLET | Freq: Once | ORAL | Status: DC
  Filled 2023-04-27: qty 1

## 2023-04-27 MED ORDER — POLYETHYLENE GLYCOL 3350 17 G PO PACK: 17.0000 g | PACK | Freq: Two times a day (BID) | ORAL | 0 refills | Status: DC

## 2023-04-27 MED ORDER — LIP MEDEX EX OINT
TOPICAL_OINTMENT | CUTANEOUS | Status: DC | PRN
  Filled 2023-04-27: qty 7

## 2023-04-27 MED ORDER — LIP MEDEX EX OINT: TOPICAL_OINTMENT | CUTANEOUS | 0 refills | Status: DC | PRN

## 2023-04-27 MED ORDER — METHOCARBAMOL 500 MG PO TABS: 500.0000 mg | ORAL_TABLET | Freq: Four times a day (QID) | ORAL | Status: DC | PRN

## 2023-04-27 MED ORDER — ACETAMINOPHEN 325 MG PO TABS: 325.0000 mg | ORAL_TABLET | Freq: Four times a day (QID) | ORAL | Status: DC | PRN

## 2023-04-27 MED ORDER — PANTOPRAZOLE SODIUM 40 MG PO TBEC: 40.0000 mg | DELAYED_RELEASE_TABLET | Freq: Every day | ORAL | Status: DC

## 2023-04-27 NOTE — TOC Transition Note (Signed)
Transition of Care [facility]) - CM/SW Discharge Note  Patient Details  Name: Darren [name] MRN: [id number] Date of Birth: 1934/07/02  Transition of Care [facility]) CM/SW Contact:  Ewing Schlein, LCSW Phone Number: 04/27/2023, 11:58 AM  Clinical Narrative: Patient is medically stable to discharge to [facility] today. CSW confirmed bed with [facility]. Discharge summary, discharge orders, and SNF transfer report faxed to facility in hub. Medical necessity form done; PTAR scheduled. Discharge packet completed. CSW updated son regarding discharge and transportation being set up. LPN updated. TOC signing off.   Final next level of care: Skilled Nursing Facility Barriers to Discharge: Barriers Resolved  Patient Goals and CMS Choice CMS Medicare.gov Compare Post Acute Care list provided to:: Patient Represenative (must comment) Choice offered to / list presented to : Adult Children (Rowdy Schrader (spouse))  Discharge Placement PASRR number recieved: 04/25/23    Patient chooses bed at: [facility] Re[facility] Patient to be transferred to facility by: PTAR Name of family member notified: Abdurrahman Southard (son) Patient and family notified of of transfer: 04/27/23  Discharge Plan and Services Additional resources added to the After Visit Summary for   In-house Referral: Clinical Social Work Post Acute Care Choice: Skilled Nursing Facility           Social Determinants of Health (SDOH) Interventions SDOH Screenings   Food Insecurity: Patient Unable To Answer (04/23/2023)  Housing: Patient Unable To Answer (04/05/2023)  Transportation Needs: No Transportation Needs (04/05/2023)  Depression (PHQ2-9): Low Risk  (04/06/2023)  Financial Resource Strain: Low Risk  (04/05/2023)  Physical Activity: Unknown (04/05/2023)  Social Connections: Moderately Isolated (04/05/2023)  Stress: Stress Concern Present (04/05/2023)  Tobacco Use: Medium Risk (04/25/2023)   Readmission Risk Interventions     No data to display

## 2023-04-27 NOTE — Progress Notes (Signed)
Physical Therapy Treatment Patient Details Name: Darren [name] MRN: [id number] DOB: 1934-02-12 Today's Date: 04/27/2023   History of Present Illness 89 y.o. male  who presented to the ED on 04/23/2023 from home after a witnessed fall onto his left side. x-ray confirmed comminuted left intertrochanteric femur fracture for which he was admitted with plans for operative management; s/p IM nail placement 04/24/23 PMH: CVA, dementia, Parkinson's disease, HTN, HLD    PT Comments    Pt not at baseline mentation per dtr  today; overall cooperative with PT, alert but  with incr difficulty following commands.  Pt requiring incr assist to sit EOB; able to maintain static sit with close supervision and perform minimal wt shifting, occasional posterior LOB. Worked standing/wt shifting which pt tolerated well. D/c plan remains appropriate.   Recommendations for follow up therapy are one component of a multi-disciplinary discharge planning process, led by the attending physician.  Recommendations may be updated based on patient status, additional functional criteria and insurance authorization.  Follow Up Recommendations  Can patient physically be transported by private vehicle: No    Assistance Recommended at Discharge Frequent or constant Supervision/Assistance  Patient can return home with the following A lot of help with bathing/dressing/bathroom;A lot of help with walking and/or transfers;Assist for transportation;Help with stairs or ramp for entrance;Assistance with cooking/housework;Direct supervision/assist for financial management;Direct supervision/assist for medications management   Equipment Recommendations  None recommended by PT;Other (comment)    Recommendations for Other Services       Precautions / Restrictions Precautions Precautions: Fall Restrictions LLE Weight Bearing: Partial weight bearing LLE Partial Weight Bearing Percentage or Pounds: 50     Mobility  Bed Mobility Overal  bed mobility: Needs Assistance Bed Mobility: Supine to Sit, Sit to Supine     Supine to sit: Max assist, +2 for safety/equipment, +2 for physical assistance Sit to supine: Max assist, Total assist, +2 for physical assistance, +2 for safety/equipment   General bed mobility comments: assist to progress LEs off bed and elevate trunk, incr time; assist to control trunk descent and elevate LEs    Transfers Overall transfer level: Needs assistance Equipment used: Rolling walker (2 wheels) Transfers: Sit to/from Stand Sit to Stand: Min assist, Mod assist, +2 physical assistance, +2 safety/equipment           General transfer comment: multi-modal cues for hand placement, LLE Position. assist to rise and stabilize, initial posterior bias-able to correct with cues and assist    Ambulation/Gait Ambulation/Gait assistance: Mod assist, +2 physical assistance, +2 safety/equipment   Assistive device: Rolling walker (2 wheels)         General Gait Details:  (lateral steps along EOB only; assist to balance and maneuver RW, cues for trunk/hip extension and PWB)   Stairs             Wheelchair Mobility    Modified Rankin (Stroke Patients Only)       Balance   Sitting-balance support: Feet supported, Single extremity supported Sitting balance-Leahy Scale: Fair     Standing balance support: Reliant on assistive device for balance, During functional activity Standing balance-Leahy Scale: Poor Standing balance comment: reliant on UEs and external assist                            Cognition Arousal/Alertness: Awake/alert Behavior During Therapy: Flat affect Overall Cognitive Status: History of cognitive impairments - at baseline  General Comments: pt verbalizing very little, responses are appropriate  when pt answers questions regarding pain/mobility/yes-nos; pt does not recognize his dtr today, says she is "sister";  incr difficulty following commands this session        Exercises      General Comments        Pertinent Vitals/Pain Pain Assessment Pain Assessment: Faces Faces Pain Scale: Hurts a little bit Pain Location: "everything" Pain Descriptors / Indicators: Grimacing, Guarding Pain Intervention(s): Limited activity within patient's tolerance, Monitored during session, Repositioned    Home Living                          Prior Function            PT Goals (current goals can now be found in the care plan section) Acute Rehab PT Goals PT Goal Formulation: With patient Time For Goal Achievement: 05/09/23 Potential to Achieve Goals: Good Progress towards PT goals: Progressing toward goals    Frequency    Min 3X/week      PT Plan Current plan remains appropriate    Co-evaluation              AM-PAC PT "6 Clicks" Mobility   Outcome Measure  Help needed turning from your back to your side while in a flat bed without using bedrails?: A Lot Help needed moving from lying on your back to sitting on the side of a flat bed without using bedrails?: Total Help needed moving to and from a bed to a chair (including a wheelchair)?: Total Help needed standing up from a chair using your arms (e.g., wheelchair or bedside chair)?: Total Help needed to walk in hospital room?: Total Help needed climbing 3-5 steps with a railing? : Total 6 Click Score: 7    End of Session Equipment Utilized During Treatment: Gait belt Activity Tolerance: Patient tolerated treatment well Patient left: in bed;with call bell/phone within reach;with bed alarm set;with family/visitor present;Other (comment) (rails x4 per family request) Nurse Communication: Mobility status PT Visit Diagnosis: Other abnormalities of gait and mobility (R26.89);Difficulty in walking, not elsewhere classified (R26.2)     Time: 1478-2956 PT Time Calculation (min) (ACUTE ONLY): 16 min  Charges:  $Gait  Training: 8-22 mins                     Delice Bison, PT  Acute Rehab Dept [facility]) [phone]  04/27/2023    [facility] 04/27/2023, 12:33 PM

## 2023-04-27 NOTE — Discharge Summary (Signed)
Physician Discharge Summary   Patient: Darren [name] MRN: [id number] DOB: 03-Mar-1934  Admit date:     04/23/2023  Discharge date: 04/27/23  Discharge Physician: Thad Ranger, MD    PCP: [name], MD   Recommendations at discharge:   Continue aspirin 81 mg daily  PWB LLE 50%  Outpatient follow-up with orthopedics in 2 weeks Dysphagia 3 diet with thin liquids.  Liquid administration via straw.  Medication administration crushed with pure and seated upright at 90 degrees, remain upright for at least 30 minutes after p.o. intake  Discharge Diagnoses:    Closed left subtrochanteric femur fracture, initial encounter (HCC)   Parkinson disease   Hyperlipidemia   History of CVA (cerebrovascular accident)   Gastroesophageal reflux disease without esophagitis   Essential hypertension   Fall at home, initial encounter   Hospital Course: Darren [name] is an 89 y.o. male with a history of CVA, dementia, PD, HTN, HLD who presented to the ED on 04/23/2023 from home after a witnessed fall onto his left side. XR confirmed comminuted left intertrochanteric femur fracture.  Orthopedics was consulted, underwent closed reduction and IM nail placement on 6/3.    Assessment and Plan:  Mechanical fall with closed left subtrochanteric femur fracture(HCC) -Orthopedics consulted, underwent closed reduction and IM nail placement on 6/3, postop day #3 -Orthopedics following, seen by PT recommended SNF -Per orthopedics, aspirin 81 mg daily for 4 weeks, PWB LLE 50%      Parkinson disease with advanced dementia, aspiration -Has chronic aspiration, per family worse today, SLP evaluation today -Continue Sinemet, PT  -SLP evaluation done on 6/5, with moderate aspiration risk, continue dysphagia 3 diet with thin liquids.  See instructions above     Hyperlipidemia -Continue statin       History of CVA (cerebrovascular accident) -Continue statin, aspirin, BP control       Gastroesophageal reflux  disease without esophagitis -Continue PPI       Essential hypertension -Continue amlodipine-benazepril       Fall at home, initial encounter PT recommended SNF.   Estimated body mass index is 22.69 kg/m as calculated from the following:   Height as of this encounter: 5\' 9"  (1.753 m).   Weight as of this encounter: 69.7 kg.       Pain control - [employer] Controlled Substance Reporting System database was reviewed. and patient was instructed, not to drive, operate heavy machinery, perform activities at heights, swimming or participation in water activities or provide baby-sitting services while on Pain, Sleep and Anxiety Medications; until their outpatient Physician has advised to do so again. Also recommended to not to take more than prescribed Pain, Sleep and Anxiety Medications.  Consultants: Orthopedics Procedures performed: Closed reduction, intramedullary nailing of left intertrochanteric femur fracture   Disposition: Skilled nursing facility Diet recommendation: Dysphagia 3 diet with thin liquids  DISCHARGE MEDICATION: Allergies as of 04/27/2023   No Known Allergies      Medication List     STOP taking these medications    AMBULATORY NON FORMULARY MEDICATION   Aspirin Low Dose 81 MG tablet Generic drug: aspirin EC Replaced by: aspirin 81 MG chewable tablet   fluticasone-salmeterol 100-50 MCG/ACT Aepb Commonly known as: ADVAIR       TAKE these medications    acetaminophen 325 MG tablet Commonly known as: TYLENOL Take 1-2 tablets (325-650 mg total) by mouth every 6 (six) hours as needed for mild pain (pain score 1-3 or temp > 100.5).   albuterol 108 (  90 Base) MCG/ACT inhaler Commonly known as: VENTOLIN HFA INHALE 2 PUFFS INTO THE LUNGS EVERY 6 HOURS AS NEEDED FOR WHEEZING OR SHORTNESS OF BREATH What changed: how to take this   amLODipine-benazepril 10-20 MG capsule Commonly known as: LOTREL TAKE 1 CAPSULE BY MOUTH EVERY DAY   aspirin 81 MG chewable  tablet Chew 1 tablet (81 mg total) by mouth 2 (two) times daily for 28 days. Replaces: Aspirin Low Dose 81 MG tablet   atorvastatin 40 MG tablet Commonly known as: LIPITOR TAKE 1 TABLET BY MOUTH EVERY DAY   carbidopa-levodopa 50-200 MG tablet Commonly known as: SINEMET CR TAKE 1 TABLET BY MOUTH AT BEDTIME   carbidopa-levodopa 25-100 MG tablet Commonly known as: SINEMET IR TAKE 2 TABLETS BY MOUTH 2 TIMES DAILY morning and noon and TAKE 1 TABLET EVERY DAY AT 4PM   D3 Super Strength 50 MCG (2000 UT) Caps Generic drug: Cholecalciferol TAKE 2 CAPSULES BY MOUTH EVERY DAY   docusate sodium 100 MG capsule Commonly known as: COLACE Take 1 capsule (100 mg total) by mouth 2 (two) times daily.   fluticasone 50 MCG/ACT nasal spray Commonly known as: FLONASE USE 2 SPRAYS in each nostril 2 TIMES DAILY What changed: See the new instructions.   methocarbamol 500 MG tablet Commonly known as: ROBAXIN Take 1 tablet (500 mg total) by mouth every 6 (six) hours as needed for muscle spasms.   omeprazole 20 MG capsule Commonly known as: PRILOSEC TAKE 1 CAPSULE BY MOUTH EVERY DAY What changed:  how much to take how to take this when to take this   polyethylene glycol 17 g packet Commonly known as: MIRALAX / GLYCOLAX Take 17 g by mouth 2 (two) times daily.   QUEtiapine 25 MG tablet Commonly known as: SEROQUEL 1/2-1 tablet at 4pm, 1 po q hs What changed:  how much to take how to take this when to take this additional instructions        Follow-up Information     Durene Romans, MD. Schedule an appointment as soon as possible for a visit in 2 week(s).   Specialty: Orthopedic Surgery Contact information: [address] [place] [address] [phone]         [name], MD. Schedule an appointment as soon as possible for a visit in 2 week(s).   Specialty: Family Medicine Why: for hospital follow-up Contact information: [address] [place] [address] [phone]                Discharge Exam: Ceasar Mons Weights   04/23/23 1954  Weight: 69.7 kg   S: No acute issues overnight, resting comfortably.  Caregiver at the bedside.  BP (!) 151/66 (BP Location: Left Arm)   Pulse 75   Temp 98.7 F (37.1 C) (Oral)   Resp 18   Ht 5\' 9"  (1.753 m)   Wt 69.7 kg   SpO2 96%   BMI 22.69 kg/m    Physical Exam General: Resting comfortably, NAD Cardiovascular: S1 S2 clear, RRR.  Respiratory: CTAB, no wheezing Gastrointestinal: Soft, nontender, nondistended, NBS Ext: no pedal edema bilaterally Neuro: no new deficits Psych: has dementia   Condition at discharge: fair  The results of significant diagnostics from this hospitalization (including imaging, microbiology, ancillary and laboratory) are listed below for reference.   Imaging Studies: DG HIP UNILAT WITH PELVIS 2-3 VIEWS LEFT  Result Date: 04/24/2023 CLINICAL DATA:  Elective surgery. EXAM: DG HIP (WITH OR WITHOUT PELVIS) 2-3V LEFT COMPARISON:  Preoperative  radiograph FINDINGS: Seven fluoroscopic spot views of the left hip and pelvis obtained in the operating room. Intramedullary nail with trans trochanteric and distal locking screw fixation traverse intertrochanteric femur fracture. Fluoroscopy time 33 seconds. Dose 4.84 mGy. IMPRESSION: Fluoroscopic spot views for intertrochanteric femur fracture ORIF. Electronically Signed   By: Narda Rutherford M.D.   On: 04/24/2023 18:21   DG C-Arm 1-60 Min-No Report  Result Date: 04/24/2023 Fluoroscopy was utilized by the requesting physician.  No radiographic interpretation.   DG Hip Unilat W or Wo Pelvis 2-3 Views Left  Result Date: 04/23/2023 CLINICAL DATA:  Fall EXAM: DG HIP (WITH OR WITHOUT PELVIS) 2-3V LEFT; LEFT FEMUR 2 VIEWS COMPARISON:  None Available. FINDINGS: Left hip: There is an acute comminuted left femoral intratrochanteric fracture. There is superolateral displacement of the distal fracture fragment.  There is no dislocation. Surgical clips overlie the right pelvis. Left femur: There are no additional fractures of the left femur. There is no dislocation. Soft tissues are within normal limits. IMPRESSION: Acute comminuted left femoral intratrochanteric fracture with superolateral displacement of the distal fracture fragment. Electronically Signed   By: Darliss Cheney M.D.   On: 04/23/2023 21:31   DG Femur Min 2 Views Left  Result Date: 04/23/2023 CLINICAL DATA:  Fall EXAM: DG HIP (WITH OR WITHOUT PELVIS) 2-3V LEFT; LEFT FEMUR 2 VIEWS COMPARISON:  None Available. FINDINGS: Left hip: There is an acute comminuted left femoral intratrochanteric fracture. There is superolateral displacement of the distal fracture fragment. There is no dislocation. Surgical clips overlie the right pelvis. Left femur: There are no additional fractures of the left femur. There is no dislocation. Soft tissues are within normal limits. IMPRESSION: Acute comminuted left femoral intratrochanteric fracture with superolateral displacement of the distal fracture fragment. Electronically Signed   By: Darliss Cheney M.D.   On: 04/23/2023 21:31   CT ABDOMEN PELVIS W CONTRAST  Result Date: 03/31/2023 CLINICAL DATA:  Abdominal pain status post fall. Nonlocalized suprapubic and umbilical tenderness palpation. EXAM: CT ABDOMEN AND PELVIS WITH CONTRAST TECHNIQUE: Multidetector CT imaging of the abdomen and pelvis was performed using the standard protocol following bolus administration of intravenous contrast. RADIATION DOSE REDUCTION: This exam was performed according to the departmental dose-optimization program which includes automated exposure control, adjustment of the mA and/or kV according to patient size and/or use of iterative reconstruction technique. CONTRAST:  OMNIPAQUE IOHEXOL 300 MG/ML  SOLN COMPARISON:  None Available. FINDINGS: Lower chest: Scattered coronary artery calcifications are noted. The lung bases are clear.  Hepatobiliary: No focal liver abnormality is seen. Status post cholecystectomy. No biliary dilatation. Pancreas: Unremarkable. No pancreatic ductal dilatation or surrounding inflammatory changes. Spleen: Hypodensities are noted in the spleen measuring up to 1.4 cm, statistically most likely representing cysts or hemangiomas. Adrenals/Urinary Tract: The adrenal glands are within normal limits. The kidneys enhance symmetrically. There is a cyst in the mid left kidney. A 1.3 cm complex hypodensity is noted in the lower pole of the left kidney. Additional subcentimeter hypodensities are noted in the left kidney which are too small to further characterize. No renal calculus or hydronephrosis. The bladder is unremarkable. Stomach/Bowel: There is a small hiatal hernia. Stomach is within normal limits. Appendix is surgically absent. No evidence of bowel wall thickening, distention, or inflammatory changes. No free air or pneumatosis. Multiple scattered diverticula are present along the colon without evidence of diverticulitis. Vascular/Lymphatic: Aortic atherosclerosis. No enlarged abdominal or pelvic lymph nodes. Reproductive: Prostate gland is mildly enlarged. Other: No abdominopelvic ascites. Musculoskeletal: Degenerative  changes are present in the thoracolumbar spine. No acute osseous abnormality. IMPRESSION: 1. No acute intra-abdominal process. 2. Complex lesion in the mid left kidney measuring 1.3 cm with indeterminate imaging characteristics. Ultrasound is suggested for further characterization on nonemergent follow-up. 3. Small hiatal hernia. 4. Colonic diverticulosis without diverticulitis. 5. Aortic atherosclerosis and coronary artery calcifications. Electronically Signed   By: Thornell Sartorius M.D.   On: 03/31/2023 03:32   CT HEAD WO CONTRAST ( )  Result Date: 03/30/2023 CLINICAL DATA:  Multiple falls. EXAM: CT HEAD WITHOUT CONTRAST TECHNIQUE: Contiguous axial images were obtained from the base of the skull  through the vertex without intravenous contrast. RADIATION DOSE REDUCTION: This exam was performed according to the departmental dose-optimization program which includes automated exposure control, adjustment of the mA and/or kV according to patient size and/or use of iterative reconstruction technique. COMPARISON:  January 15, 2022 FINDINGS: Brain: There is moderate severity cerebral atrophy with widening of the extra-axial spaces and ventricular dilatation. There are areas of decreased attenuation within the white matter tracts of the supratentorial brain, consistent with microvascular disease changes. A chronic right frontal lobe infarct is seen. Vascular: No hyperdense vessel or unexpected calcification. Skull: Normal. Negative for fracture or focal lesion. Sinuses/Orbits: Sequelae associated with chronic bilateral maxillary sinusitis is seen. Other: None. IMPRESSION: 1. Generalized cerebral atrophy and chronic white matter small vessel ischemic change, without evidence of an acute intracranial abnormality. 2. Chronic right frontal lobe infarct. Electronically Signed   By: Aram Candela M.D.   On: 03/30/2023 23:47   DG Wrist Complete Left  Result Date: 03/30/2023 CLINICAL DATA:  Left wrist pain. Fall. Ulnar-sided pain. EXAM: LEFT WRIST - COMPLETE 3+ VIEW COMPARISON:  None Available. FINDINGS: There is no evidence of fracture or dislocation. The alignment is normal. The joint spaces are preserved. There is moderate osteoarthritis of the thumb carpal metacarpal joint. There is mild soft tissue edema about the dorsal ulnar aspect. IMPRESSION: 1. Soft tissue edema without acute fracture or dislocation. 2. 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 Electronically Signed   By: Narda Rutherford M.D.   On: 03/30/2023 23:47   DG Chest 2 View  Result Date: 03/30/2023 CLINICAL DATA:  Cough and fever. EXAM: CHEST - 2 VIEW COMPARISON:  None Available. FINDINGS: The patient is rotated. The heart is  normal in size. Aortic atherosclerosis. Normal mediastinal contours allowing for rotation. There is no focal airspace disease or evidence of pneumonia. Normal pulmonary vasculature. No pleural effusion or pneumothorax. No acute osseous abnormalities are seen. IMPRESSION: No acute chest finding. Electronically Signed   By: Narda Rutherford M.D.   On: 03/30/2023 23:45    Microbiology: Results for orders placed or performed during the hospital encounter of 04/23/23  Surgical pcr screen     Status: None   Collection Time: 04/23/23 11:10 PM   Specimen: Nasal Mucosa; Nasal Swab  Result Value Ref Range Status   MRSA, PCR NEGATIVE NEGATIVE Final   Staphylococcus aureus NEGATIVE NEGATIVE Final    Comment: (NOTE) The Xpert SA Assay (FDA approved for NASAL specimens in patients 83 years of age and older), is one component of a comprehensive surveillance program. It is not intended to diagnose infection nor to guide or monitor treatment. Performed at [facility], [address], [place], [place] [address]     Labs: CBC: Recent Labs  Lab 04/23/23 2035 04/24/23 0401 04/25/23 0845 04/26/23 0339  WBC 7.6 6.3 7.3 8.7  HGB 10.7* 9.5* 8.5* 9.2*  HCT 31.8*  29.3* 26.2* 27.9*  MCV 94.6 96.7 96.0 95.9  PLT 229 178 185 191   Basic Metabolic Panel: Recent Labs  Lab 04/23/23 2035 04/24/23 0401 04/25/23 0845 04/26/23 0339  NA 140 141 142 143  K 4.4 3.9 4.1 4.1  CL 108 111 111 110  CO2 22 23 24 25   GLUCOSE 120* 143* 120* 126*  BUN 29* 29* 26* 27*  CREATININE 1.16 1.10 0.98 1.00  CALCIUM 8.8* 8.4* 8.4* 8.7*   Liver Function Tests: Recent Labs  Lab 04/24/23 0401 04/25/23 0845 04/26/23 0339  AST 491* 159* 84*  ALT 47* 41 43  ALKPHOS 109 113 102  BILITOT 1.2 0.5 0.8  PROT 5.7* 6.0* 6.4*  ALBUMIN 3.4* 3.4* 3.4*   CBG: No results for input(s): "GLUCAP" in the last 168 hours.  Discharge time spent: greater than 30 minutes.  Signed: Thad Ranger, MD [facility] 04/27/2023

## 2023-04-27 NOTE — Plan of Care (Signed)
?  Problem: Nutrition: ?Goal: Adequate nutrition will be maintained ?Outcome: Progressing ?  ?Problem: Elimination: ?Goal: Will not experience complications related to urinary retention ?Outcome: Progressing ?  ?Problem: Safety: ?Goal: Ability to remain free from injury will improve ?Outcome: Progressing ?  ?

## 2023-05-02 ENCOUNTER — Telehealth: Payer: Self-pay | Admitting: Family Medicine

## 2023-05-02 NOTE — Telephone Encounter (Signed)
Called pt in regards to the Hospital f/u for 06/22/23 with PCP that was scheduled via MyChart. I was calling to offer a sooner appointment. Patient's wife answer and stated he was in rehab currently and is unsure when they will release him. In regards to hospital f/u patient's spouse is unaware of how it was scheduled and states they will call back to reschedule once he is out rehab.

## 2023-05-05 ENCOUNTER — Other Ambulatory Visit: Payer: Self-pay | Admitting: *Deleted

## 2023-05-05 NOTE — Patient Outreach (Signed)
Mr. Plesha resides in [facility] skilled nursing facility.  Screening for potential [facility] care coordination services as a benefit of health plan and primary care provider.  Collaboration with Darien Ramus, Sales promotion account executive. Anticipated transition plan is to return to [facility] with spouse and 24hr care.   No identifiable [facility] care coordination needs at this time.    Raiford Noble, MSN, RN,BSN [facility] Post Acute Care Coordinator [phone] (Direct dial)

## 2023-05-23 ENCOUNTER — Telehealth: Payer: Self-pay | Admitting: Family Medicine

## 2023-05-23 NOTE — Telephone Encounter (Signed)
You can place on cancellation list or see if you can find an open slot such as same-day but I am out of office next week and Friday this week

## 2023-05-23 NOTE — Telephone Encounter (Signed)
See below

## 2023-05-23 NOTE — Telephone Encounter (Signed)
D/C from Hospital on 05/26/23. Scheduled HFU 06/21/23. Can be seen earlier? Please advise.

## 2023-05-30 ENCOUNTER — Other Ambulatory Visit: Payer: Self-pay | Admitting: Family Medicine

## 2023-05-30 NOTE — Telephone Encounter (Signed)
Called Patient-spoke with son-to offer sooner appointment. Son stated will call back later.

## 2023-06-08 ENCOUNTER — Encounter (HOSPITAL_COMMUNITY): Payer: Medicare Other

## 2023-06-08 ENCOUNTER — Ambulatory Visit: Payer: Medicare Other | Admitting: Vascular Surgery

## 2023-06-21 ENCOUNTER — Inpatient Hospital Stay: Payer: Medicare Other | Admitting: Family Medicine

## 2023-06-22 ENCOUNTER — Ambulatory Visit: Payer: Medicare Other | Admitting: Family Medicine

## 2023-06-22 ENCOUNTER — Inpatient Hospital Stay: Payer: Medicare Other | Admitting: Family Medicine

## 2023-06-22 DEATH — deceased

## 2023-07-20 ENCOUNTER — Ambulatory Visit: Payer: Medicare Other | Admitting: Neurology
